# Patient Record
Sex: Male | Born: 1980 | Race: White | Hispanic: No | Marital: Married | Smoking: Former smoker
Health system: Southern US, Community
[De-identification: ages and names within clinical notes are randomized; demographics above are authoritative.]

## PROBLEM LIST (undated history)

## (undated) DIAGNOSIS — N62 Hypertrophy of breast: Secondary | ICD-10-CM

## (undated) DIAGNOSIS — I491 Atrial premature depolarization: Secondary | ICD-10-CM

## (undated) DIAGNOSIS — L409 Psoriasis, unspecified: Secondary | ICD-10-CM

## (undated) DIAGNOSIS — I10 Essential (primary) hypertension: Secondary | ICD-10-CM

## (undated) DIAGNOSIS — E785 Hyperlipidemia, unspecified: Secondary | ICD-10-CM

## (undated) DIAGNOSIS — G4733 Obstructive sleep apnea (adult) (pediatric): Secondary | ICD-10-CM

## (undated) HISTORY — DX: Psoriasis, unspecified: L40.9

## (undated) HISTORY — DX: Atrial premature depolarization: I49.1

## (undated) HISTORY — DX: Essential (primary) hypertension: I10

## (undated) HISTORY — DX: Obstructive sleep apnea (adult) (pediatric): G47.33

## (undated) HISTORY — DX: Hypertrophy of breast: N62

## (undated) HISTORY — DX: Hyperlipidemia, unspecified: E78.5

## (undated) HISTORY — PX: NO PAST SURGERIES: SHX2092

---

## 1999-01-07 ENCOUNTER — Emergency Department (HOSPITAL_COMMUNITY): Admission: EM | Admit: 1999-01-07 | Discharge: 1999-01-07 | Payer: Self-pay | Admitting: Emergency Medicine

## 2002-05-31 ENCOUNTER — Encounter: Payer: Self-pay | Admitting: Emergency Medicine

## 2002-05-31 ENCOUNTER — Emergency Department (HOSPITAL_COMMUNITY): Admission: EM | Admit: 2002-05-31 | Discharge: 2002-05-31 | Payer: Self-pay | Admitting: Emergency Medicine

## 2002-08-18 ENCOUNTER — Emergency Department (HOSPITAL_COMMUNITY): Admission: EM | Admit: 2002-08-18 | Discharge: 2002-08-18 | Payer: Self-pay | Admitting: Emergency Medicine

## 2003-03-17 ENCOUNTER — Encounter: Admission: RE | Admit: 2003-03-17 | Discharge: 2003-03-17 | Payer: Self-pay | Admitting: Internal Medicine

## 2003-03-17 ENCOUNTER — Encounter: Payer: Self-pay | Admitting: Internal Medicine

## 2008-07-14 ENCOUNTER — Encounter: Admission: RE | Admit: 2008-07-14 | Discharge: 2008-07-14 | Payer: Self-pay | Admitting: Orthopedic Surgery

## 2008-09-03 ENCOUNTER — Encounter: Admission: RE | Admit: 2008-09-03 | Discharge: 2008-09-03 | Payer: Self-pay | Admitting: Family

## 2008-09-12 ENCOUNTER — Encounter: Admission: RE | Admit: 2008-09-12 | Discharge: 2008-09-12 | Payer: Self-pay | Admitting: Internal Medicine

## 2010-08-02 IMAGING — CR DG CHEST 2V
3 series · 3 of 3 positions shown · non-contrast
Comparison: None available

CLINICAL DATA: Chest pain

CHEST - 2 VIEW

[w chest pa (1 of 2)]
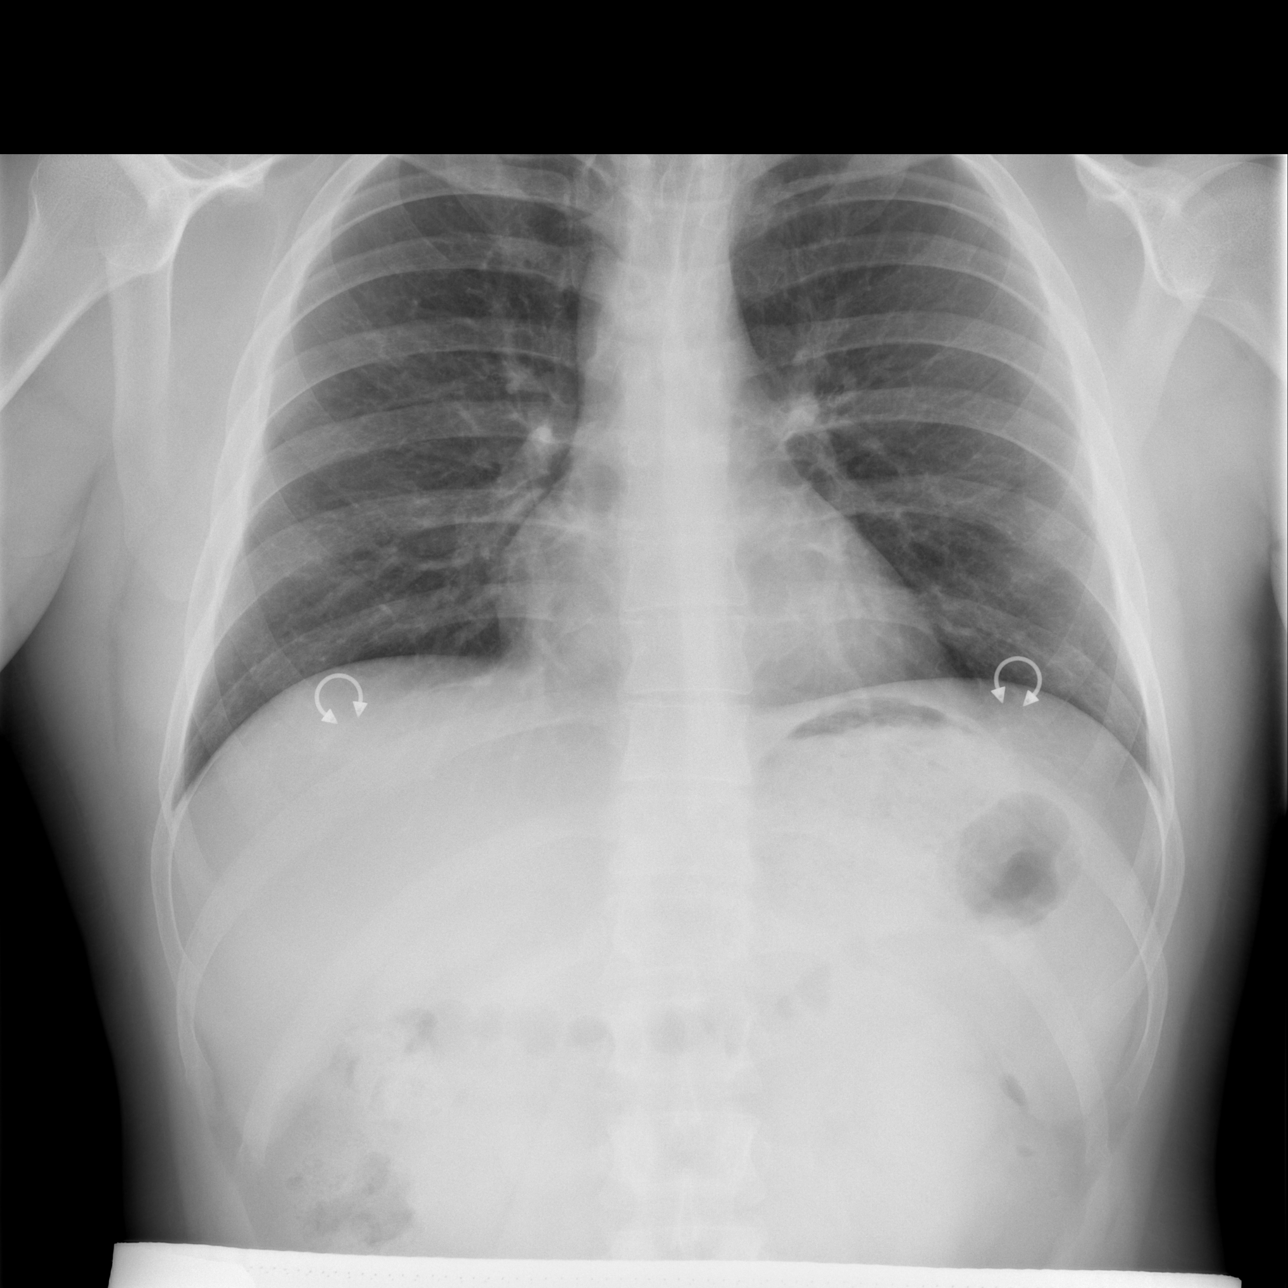

[w chest pa (2 of 2)]
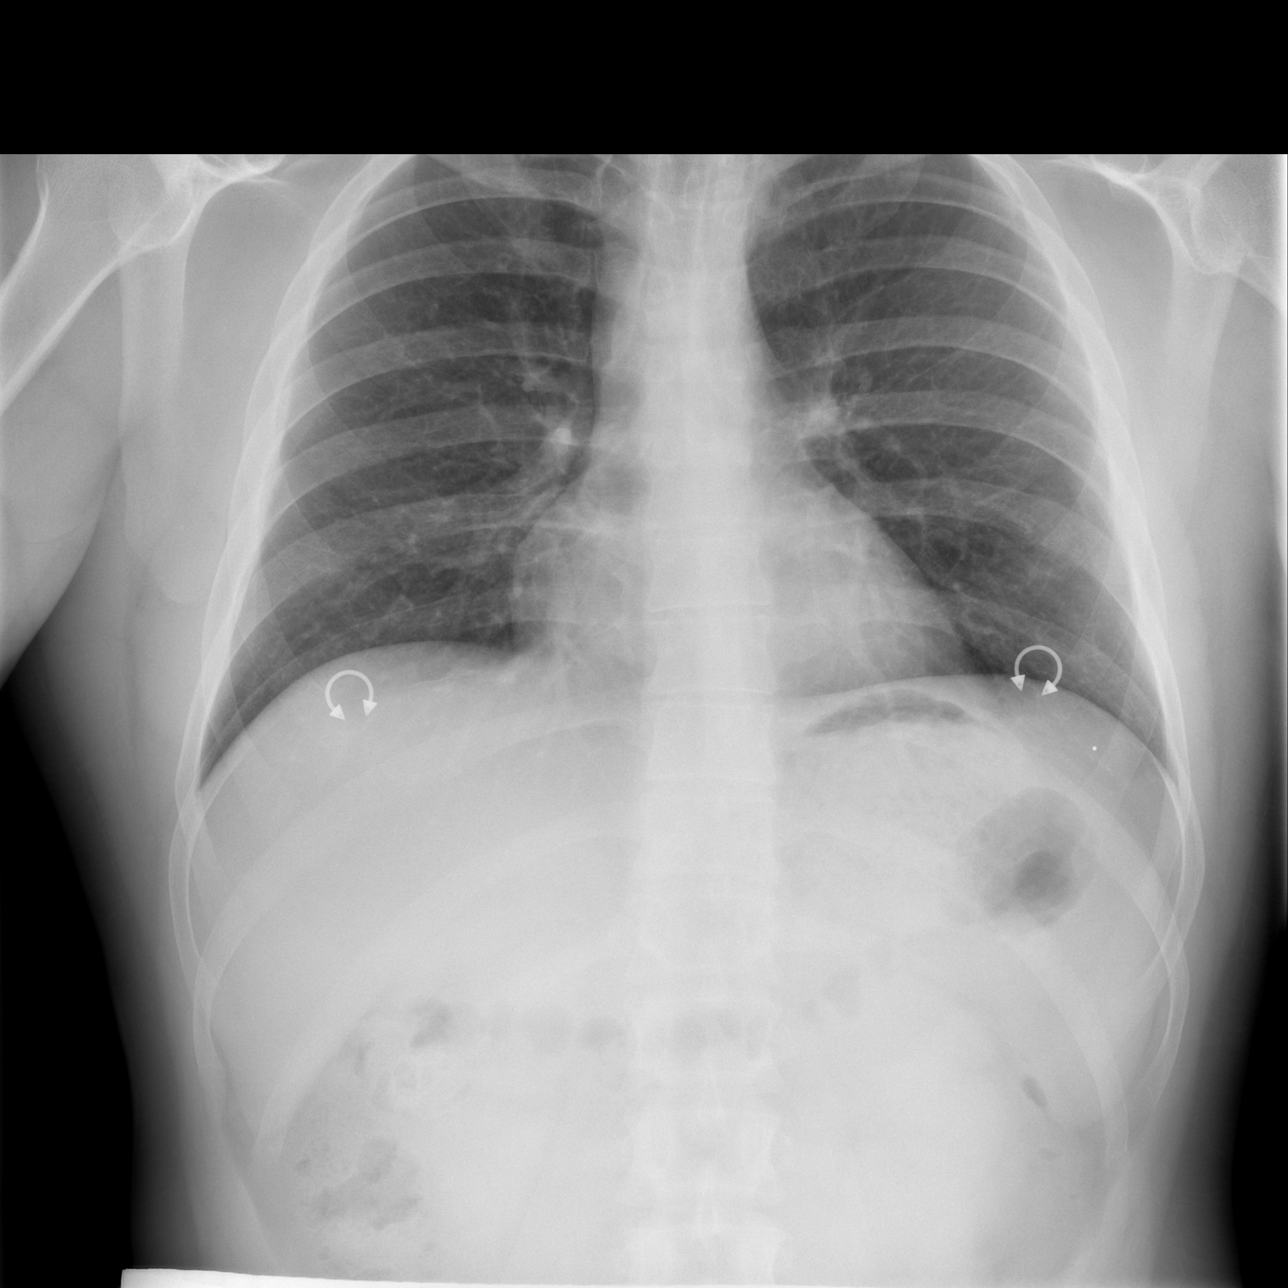

[w chest lat]
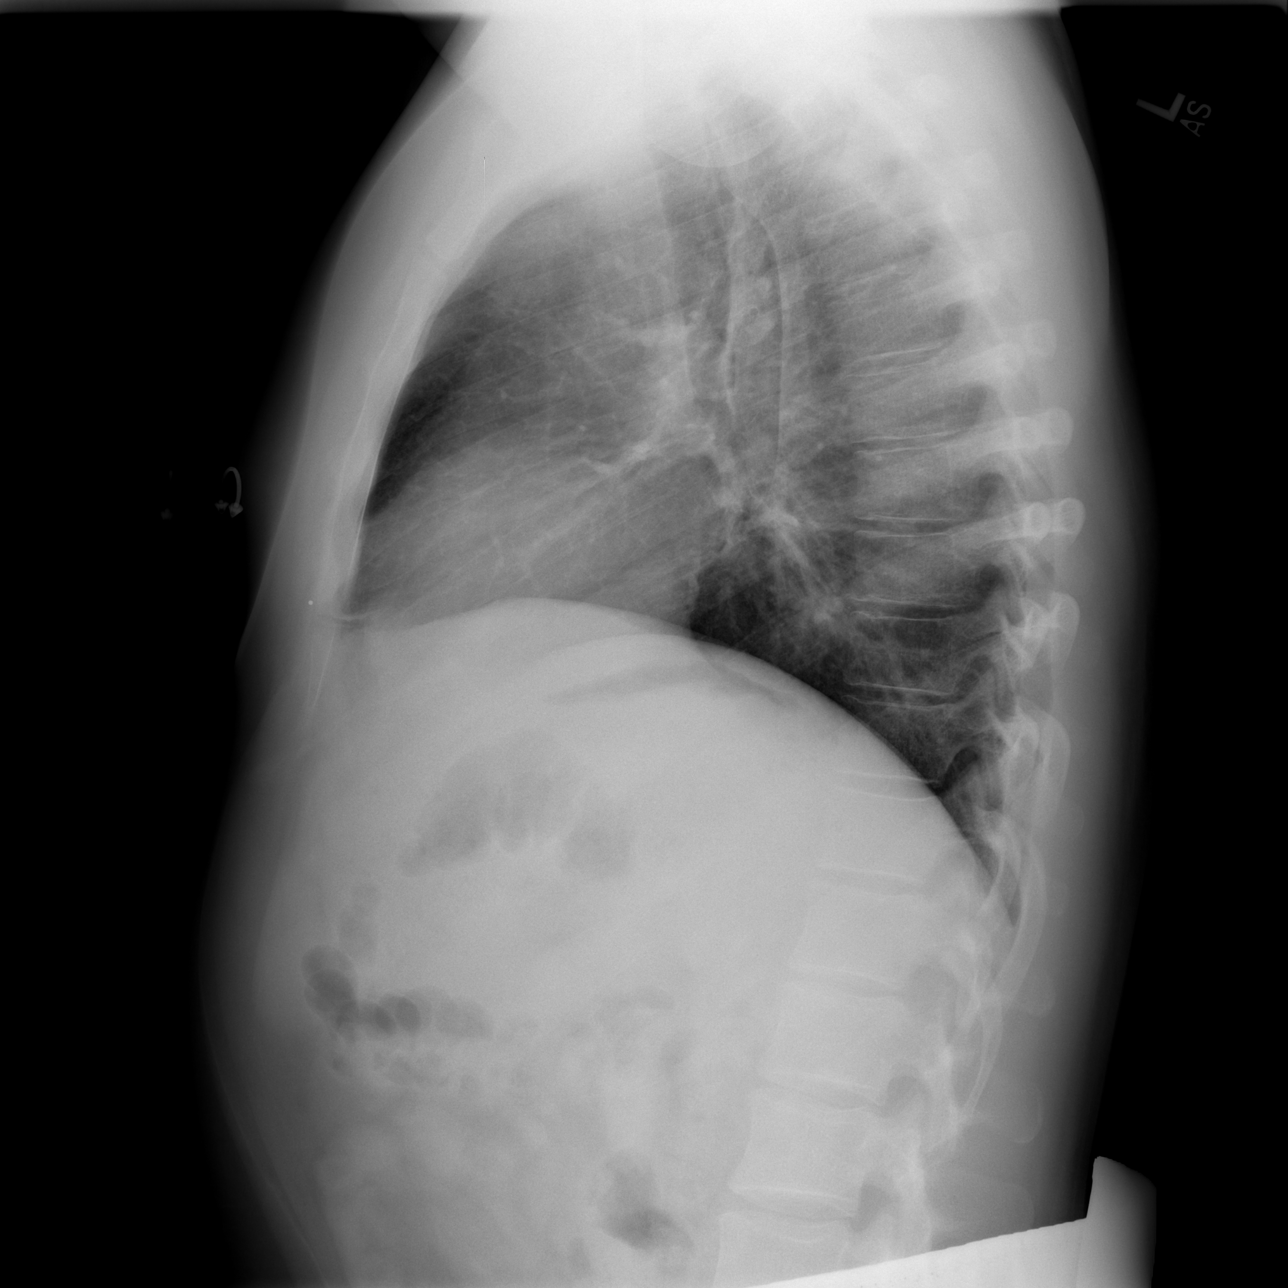

[3 of 3 positions shown; findings below may reference images not displayed]

FINDINGS: Bilateral nipple hardware. Lungs clear.  Heart size and
pulmonary vascularity normal.  No effusion.  Visualized bones
unremarkable.
IMPRESSION: No acute disease

## 2014-05-10 ENCOUNTER — Encounter: Payer: Self-pay | Admitting: *Deleted

## 2016-08-04 ENCOUNTER — Encounter: Payer: Self-pay | Admitting: Family

## 2016-08-04 ENCOUNTER — Ambulatory Visit (INDEPENDENT_AMBULATORY_CARE_PROVIDER_SITE_OTHER): Payer: BLUE CROSS/BLUE SHIELD | Admitting: Family

## 2016-08-04 ENCOUNTER — Other Ambulatory Visit (INDEPENDENT_AMBULATORY_CARE_PROVIDER_SITE_OTHER): Payer: BLUE CROSS/BLUE SHIELD

## 2016-08-04 VITALS — BP 114/78 | HR 71 | Temp 98.5°F | Resp 16 | Ht 69.0 in | Wt 209.8 lb

## 2016-08-04 DIAGNOSIS — R5383 Other fatigue: Secondary | ICD-10-CM | POA: Insufficient documentation

## 2016-08-04 DIAGNOSIS — R413 Other amnesia: Secondary | ICD-10-CM | POA: Insufficient documentation

## 2016-08-04 DIAGNOSIS — R790 Abnormal level of blood mineral: Secondary | ICD-10-CM

## 2016-08-04 LAB — B12 AND FOLATE PANEL
Folate: 17.6 ng/mL (ref >5.9)
VITAMIN B 12: 592 pg/mL (ref 211–911)

## 2016-08-04 LAB — CBC
HEMATOCRIT: 42.7 % (ref 39.0–52.0)
Hemoglobin: 14.9 g/dL (ref 13.0–17.0)
MCHC: 34.8 g/dL (ref 30.0–36.0)
MCV: 90.9 fl (ref 78.0–100.0)
PLATELETS: 203 10*3/uL (ref 150.0–400.0)
RBC: 4.7 Mil/uL (ref 4.22–5.81)
RDW: 12.5 % (ref 11.5–15.5)
WBC: 5.6 10*3/uL (ref 4.0–10.5)

## 2016-08-04 LAB — IBC PANEL
Iron: 229 ug/dL — ABNORMAL HIGH (ref 42–165)
Saturation Ratios: 64.7 % — ABNORMAL HIGH (ref 20.0–50.0)
Transferrin: 253 mg/dL (ref 212.0–360.0)

## 2016-08-04 LAB — TSH: TSH: 2.31 u[IU]/mL (ref 0.35–4.50)

## 2016-08-04 LAB — HEMOGLOBIN A1C: Hgb A1c MFr Bld: 5.6 % (ref 4.6–6.5)

## 2016-08-04 NOTE — Assessment & Plan Note (Signed)
Symptoms of fatigue and daytime sleepiness concerning for obstructive sleep apnea. Obtain B12/Foley, CBC, A1c, IBC panel, and TSH to rule out metabolic causes. Unlikely depression or cardiovascular disease. Referral to pulmonology placed for sleep testing. Continue to monitor.

## 2016-08-04 NOTE — Assessment & Plan Note (Signed)
Memory difficulty with concern for possible ADD/ADHD given description of difficulty concentrating and completing tasks. Referral to psychology place for formal testing. Follow-up pending testing results.

## 2016-08-04 NOTE — Progress Notes (Signed)
Subjective:    Patient ID: Jeffery Green, male    DOB: Jan 26, 1981, 35 y.o.   MRN: [id number]  Chief Complaint  Patient presents with  . Establish Care    tired all the time and has issues with forgetting things, x1 year    HPI:  Jeffery Green is a 35 y.o. male who  has a past medical history of PAC (premature atrial contraction) and Psoriasis. and presents today for an office visit to establish care.   1.) Fatigue -  This is a new problem. Associated symptom of fatigue and feeling tired/sleepy has been going on for about 1 year. Feels as though he can go to sleep fairly readily. Averaging about 7-8 hours of sleep per night. He does snore at night and has been informed of possible apnea periods. Never tested for sleep apnea. Not well rested after sleep. Denies headaches. Will occasionally have heart palpitations. Modifying factors include breath-rite strips.   2.) Memory issues - This is a new problem. Associated symptoms of forgetfulness has been waxing and waning. Finds that his mind wanders at times and has to but effort into focusing. Does have difficulty completing tasks.    No Known Allergies    Outpatient Medications Prior to Visit  Medication Sig Dispense Refill  . clobetasol cream (TEMOVATE) 0.05 % Apply 1 application topically 2 (two) times daily.    Marland Kitchen. albuterol (PROVENTIL HFA;VENTOLIN HFA) 108 (90 BASE) MCG/ACT inhaler Inhale 2 puffs into the lungs every 4 (four) hours as needed for wheezing or shortness of breath.    . Fluticasone-Salmeterol (ADVAIR) 250-50 MCG/DOSE AEPB Inhale 1 puff into the lungs every 12 (twelve) hours.    . Multiple Vitamin (MULTIVITAMIN) tablet Take 1 tablet by mouth daily.     No facility-administered medications prior to visit.       History reviewed. No pertinent surgical history.    Past Medical History:  Diagnosis Date  . PAC (premature atrial contraction)   . Psoriasis     Review of Systems  Constitutional: Negative  for chills and fever.  Respiratory: Negative for chest tightness and shortness of breath.   Cardiovascular: Positive for palpitations. Negative for chest pain and leg swelling.  Psychiatric/Behavioral: Positive for decreased concentration and sleep disturbance.      Objective:    BP 114/78 (BP Location: Left Arm, Patient Position: Sitting, Cuff Size: Large)   Pulse 71   Temp 98.5 F (36.9 C) (Oral)   Resp 16   Ht 5\' 9"  (1.753 m)   Wt 209 lb 12.8 oz (95.2 kg)   SpO2 98%   BMI 30.98 kg/m  Nursing note and vital signs reviewed.  Physical Exam  Constitutional: He is oriented to person, place, and time. He appears well-developed and well-nourished. No distress.  Cardiovascular: Normal rate, regular rhythm, normal heart sounds and intact distal pulses.   Pulmonary/Chest: Effort normal and breath sounds normal.  Neurological: He is alert and oriented to person, place, and time.  Skin: Skin is warm and dry.  Psychiatric: He has a normal mood and affect. His behavior is normal. Judgment and thought content normal.       Assessment & Plan:   Problem List Items Addressed This Visit      Other   Fatigue - Primary    Symptoms of fatigue and daytime sleepiness concerning for obstructive sleep apnea. Obtain B12/Foley, CBC, A1c, IBC panel, and TSH to rule out metabolic causes. Unlikely depression or cardiovascular disease. Referral to pulmonology  placed for sleep testing. Continue to monitor.      Relevant Orders   B12 and Folate Panel (Completed)   CBC (Completed)   Hemoglobin A1c (Completed)   IBC panel (Completed)   TSH (Completed)   Ambulatory referral to Pulmonology   Memory difficulty    Memory difficulty with concern for possible ADD/ADHD given description of difficulty concentrating and completing tasks. Referral to psychology place for formal testing. Follow-up pending testing results.      Relevant Orders   Ambulatory referral to Psychology       I have discontinued  Mr. Yannuzzi's multivitamin, albuterol, and Fluticasone-Salmeterol. I am also having him maintain his clobetasol cream.   Follow-up: Return if symptoms worsen or fail to improve.  Jeanine Luzalone, Gregory, FNP

## 2016-08-04 NOTE — Patient Instructions (Addendum)
Thank you for choosing [facility].  SUMMARY AND INSTRUCTIONS:  They will call to schedule your appointments with psychology and pulmonology.   Labs:  Please stop by the lab on the lower level of the building for your blood work. Your results will be released to MyChart (or called to you) after review, usually within 72 hours after test completion. If any changes need to be made, you will be notified at that same time.  1.) The lab is open from 7:30am to 5:30 pm Monday-Friday 2.) No appointment is necessary 3.) Fasting (if needed) is 6-8 hours after food and drink; black coffee and water are okay   Follow up:  If your symptoms worsen or fail to improve, please contact our office for further instruction, or in case of emergency go directly to the emergency room at the closest medical facility.    Sleep Apnea  Sleep apnea is a sleep disorder characterized by abnormal pauses in breathing while you sleep. When your breathing pauses, the level of oxygen in your blood decreases. This causes you to move out of deep sleep and into light sleep. As a result, your quality of sleep is poor, and the system that carries your blood throughout your body (cardiovascular system) experiences stress. If sleep apnea remains untreated, the following conditions can develop:  High blood pressure (hypertension).  Coronary artery disease.  Inability to achieve or maintain an erection (impotence).  Impairment of your thought process (cognitive dysfunction). There are three types of sleep apnea: 1. Obstructive sleep apnea--Pauses in breathing during sleep because of a blocked airway. 2. Central sleep apnea--Pauses in breathing during sleep because the area of the brain that controls your breathing does not send the correct signals to the muscles that control breathing. 3. Mixed sleep apnea--A combination of both obstructive and central sleep apnea. RISK FACTORS The following risk factors can increase  your risk of developing sleep apnea:  Being overweight.  Smoking.  Having narrow passages in your nose and throat.  Being of older age.  Being male.  Alcohol use.  Sedative and tranquilizer use.  Ethnicity. Among individuals younger than 35 years, African Americans are at increased risk of sleep apnea. SYMPTOMS   Difficulty staying asleep.  Daytime sleepiness and fatigue.  Loss of energy.  Irritability.  Loud, heavy snoring.  Morning headaches.  Trouble concentrating.  Forgetfulness.  Decreased interest in sex.  Unexplained sleepiness. DIAGNOSIS  In order to diagnose sleep apnea, your caregiver will perform a physical examination. A sleep study done in the comfort of your own home may be appropriate if you are otherwise healthy. Your caregiver may also recommend that you spend the night in a sleep lab. In the sleep lab, several monitors record information about your heart, lungs, and brain while you sleep. Your leg and arm movements and blood oxygen level are also recorded. TREATMENT The following actions may help to resolve mild sleep apnea:  Sleeping on your side.   Using a decongestant if you have nasal congestion.   Avoiding the use of depressants, including alcohol, sedatives, and narcotics.   Losing weight and modifying your diet if you are overweight. There also are devices and treatments to help open your airway:  Oral appliances. These are custom-made mouthpieces that shift your lower jaw forward and slightly open your bite. This opens your airway.  Devices that create positive airway pressure. This positive pressure "splints" your airway open to help you breathe better during sleep. The following devices create positive airway  pressure:  Continuous positive airway pressure (CPAP) device. The CPAP device creates a continuous level of air pressure with an air pump. The air is delivered to your airway through a mask while you sleep. This continuous  pressure keeps your airway open.  Nasal expiratory positive airway pressure (EPAP) device. The EPAP device creates positive air pressure as you exhale. The device consists of single-use valves, which are inserted into each nostril and held in place by adhesive. The valves create very little resistance when you inhale but create much more resistance when you exhale. That increased resistance creates the positive airway pressure. This positive pressure while you exhale keeps your airway open, making it easier to breath when you inhale again.  Bilevel positive airway pressure (BPAP) device. The BPAP device is used mainly in patients with central sleep apnea. This device is similar to the CPAP device because it also uses an air pump to deliver continuous air pressure through a mask. However, with the BPAP machine, the pressure is set at two different levels. The pressure when you exhale is lower than the pressure when you inhale.  Surgery. Typically, surgery is only done if you cannot comply with less invasive treatments or if the less invasive treatments do not improve your condition. Surgery involves removing excess tissue in your airway to create a wider passage way.   This information is not intended to replace advice given to you by your health care provider. Make sure you discuss any questions you have with your health care provider.   Document Released: 09/02/2002 Document Revised: 10/03/2014 Document Reviewed: 01/19/2012 Elsevier Interactive Patient Education 2016 [employer].   Attention Deficit Hyperactivity Disorder Attention deficit hyperactivity disorder (ADHD) is a problem with behavior issues based on the way the brain functions (neurobehavioral disorder). It is a common reason for behavior and academic problems in school. SYMPTOMS  There are 3 types of ADHD. The 3 types and some of the symptoms include:  Inattentive.  Gets bored or distracted easily.  Loses or forgets things.  Forgets to hand in homework.  Has trouble organizing or completing tasks.  Difficulty staying on task.  An inability to organize daily tasks and school work.  Leaving projects, chores, or homework unfinished.  Trouble paying attention or responding to details. Careless mistakes.  Difficulty following directions. Often seems like is not listening.  Dislikes activities that require sustained attention (like chores or homework).  Hyperactive-impulsive.  Feels like it is impossible to sit still or stay in a seat. Fidgeting with hands and feet.  Trouble waiting turn.  Talking too much or out of turn. Interruptive.  Speaks or acts impulsively.  Aggressive, disruptive behavior.  Constantly busy or on the go; noisy.  Often leaves seat when they are expected to remain seated.  Often runs or climbs where it is not appropriate, or feels very restless.  Combined.  Has symptoms of both of the above. Often children with ADHD feel discouraged about themselves and with school. They often perform well below their abilities in school. As children get older, the excess motor activities can calm down, but the problems with paying attention and staying organized persist. Most children do not outgrow ADHD but with good treatment can learn to cope with the symptoms. DIAGNOSIS  When ADHD is suspected, the diagnosis should be made by professionals trained in ADHD. This professional will collect information about the individual suspected of having ADHD. Information must be collected from various settings where the person lives, works, or attends  school.  Diagnosis will include:  Confirming symptoms began in childhood.  Ruling out other reasons for the child's behavior.  The health care providers will check with the child's school and check their medical records.  They will talk to teachers and parents.  Behavior rating scales for the child will be filled out by those dealing with the child  on a daily basis. A diagnosis is made only after all information has been considered. TREATMENT  Treatment usually includes behavioral treatment, tutoring or extra support in school, and stimulant medicines. Because of the way a person's brain works with ADHD, these medicines decrease impulsivity and hyperactivity and increase attention. This is different than how they would work in a person who does not have ADHD. Other medicines used include antidepressants and certain blood pressure medicines. Most experts agree that treatment for ADHD should address all aspects of the person's functioning. Along with medicines, treatment should include structured classroom management at school. Parents should reward good behavior, provide constant discipline, and set limits. Tutoring should be available for the child as needed. ADHD is a lifelong condition. If untreated, the disorder can have long-term serious effects into adolescence and adulthood. HOME CARE INSTRUCTIONS   Often with ADHD there is a lot of frustration among family members dealing with the condition. Blame and anger are also feelings that are common. In many cases, because the problem affects the family as a whole, the entire family may need help. A therapist can help the family find better ways to handle the disruptive behaviors of the person with ADHD and promote change. If the person with ADHD is young, most of the therapist's work is with the parents. Parents will learn techniques for coping with and improving their child's behavior. Sometimes only the child with the ADHD needs counseling. Your health care providers can help you make these decisions.  Children with ADHD may need help learning how to organize. Some helpful tips include:  Keep routines the same every day from wake-up time to bedtime. Schedule all activities, including homework and playtime. Keep the schedule in a place where the person with ADHD will often see it. Mark schedule  changes as far in advance as possible.  Schedule outdoor and indoor recreation.  Have a place for everything and keep everything in its place. This includes clothing, backpacks, and school supplies.  Encourage writing down assignments and bringing home needed books. Work with your child's teachers for assistance in organizing school work.  Offer your child a well-balanced diet. Breakfast that includes a balance of whole grains, protein, and fruits or vegetables is especially important for school performance. Children should avoid drinks with caffeine including:  Soft drinks.  Coffee.  Tea.  However, some older children (adolescents) may find these drinks helpful in improving their attention. Because it can also be common for adolescents with ADHD to become addicted to caffeine, talk with your health care provider about what is a safe amount of caffeine intake for your child.  Children with ADHD need consistent rules that they can understand and follow. If rules are followed, give small rewards. Children with ADHD often receive, and expect, criticism. Look for good behavior and praise it. Set realistic goals. Give clear instructions. Look for activities that can foster success and self-esteem. Make time for pleasant activities with your child. Give lots of affection.  Parents are their children's greatest advocates. Learn as much as possible about ADHD. This helps you become a stronger and better advocate for your child.  It also helps you educate your child's teachers and instructors if they feel inadequate in these areas. Parent support groups are often helpful. A national group with local chapters is called Children and Adults with Attention Deficit Hyperactivity Disorder (CHADD). SEEK MEDICAL CARE IF:  Your child has repeated muscle twitches, cough, or speech outbursts.  Your child has sleep problems.  Your child has a marked loss of appetite.  Your child develops depression.  Your  child has new or worsening behavioral problems.  Your child develops dizziness.  Your child has a racing heart.  Your child has stomach pains.  Your child develops headaches. SEEK IMMEDIATE MEDICAL CARE IF:  Your child has been diagnosed with depression or anxiety and the symptoms seem to be getting worse.  Your child has been depressed and suddenly appears to have increased energy or motivation.  You are worried that your child is having a bad reaction to a medication he or she is taking for ADHD.   This information is not intended to replace advice given to you by your health care provider. Make sure you discuss any questions you have with your health care provider.   Document Released: 09/02/2002 Document Revised: 09/17/2013 Document Reviewed: 05/20/2013 Elsevier Interactive Patient Education [employer]

## 2016-08-05 ENCOUNTER — Encounter: Payer: Self-pay | Admitting: Family

## 2016-08-05 ENCOUNTER — Other Ambulatory Visit (INDEPENDENT_AMBULATORY_CARE_PROVIDER_SITE_OTHER): Payer: BLUE CROSS/BLUE SHIELD

## 2016-08-05 DIAGNOSIS — R790 Abnormal level of blood mineral: Secondary | ICD-10-CM | POA: Diagnosis not present

## 2016-08-05 LAB — IBC PANEL
IRON: 159 ug/dL (ref 42–165)
SATURATION RATIOS: 46.9 % (ref 20.0–50.0)
TRANSFERRIN: 242 mg/dL (ref 212.0–360.0)

## 2016-08-11 ENCOUNTER — Encounter: Payer: Self-pay | Admitting: Pulmonary Disease

## 2016-08-11 ENCOUNTER — Ambulatory Visit (INDEPENDENT_AMBULATORY_CARE_PROVIDER_SITE_OTHER): Payer: BLUE CROSS/BLUE SHIELD | Admitting: Pulmonary Disease

## 2016-08-11 VITALS — BP 138/88 | HR 92 | Ht 69.0 in | Wt 208.6 lb

## 2016-08-11 DIAGNOSIS — G4710 Hypersomnia, unspecified: Secondary | ICD-10-CM | POA: Diagnosis not present

## 2016-08-11 NOTE — Patient Instructions (Signed)

## 2016-08-11 NOTE — Progress Notes (Signed)
Subjective:    Patient ID: Jeffery KrebsMichael T Wojahn, male    DOB: 06/28/1981, 35 y.o.   MRN: [id number]  HPI   This is the case of Jeffery Green, 35 y.o. Male, who was referred by Dr. Jeanine LuzGregory Calone  in consultation regarding possible OSA.   As you very well know, patient has a 15 PY smoking history, quit 2012. Has not been diagnosed with asthma or copd. Has sinus issues, flaring in spring time.   Patient has snoring, gasping, choking, witnessed apneas. Sleeps 7-8 hrs per night. Wakes up unrefreshed in am. 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 Hypersomnia affects his fxnality. He has 2 kids -- 2yo and 697 yo -- and they make hypersomnia worse. Occasional sleep talking.    ESS 17. Very symptomatic.       Review of Systems  Constitutional: Positive for unexpected weight change. Negative for fever.  HENT: Positive for congestion and sinus pressure. Negative for dental problem, ear pain, nosebleeds, postnasal drip, rhinorrhea, sneezing, sore throat and trouble swallowing.   Eyes: Negative.  Negative for redness and itching.  Respiratory: Positive for shortness of breath. Negative for cough, chest tightness and wheezing.   Cardiovascular: Negative.  Negative for palpitations and leg swelling.  Gastrointestinal: Negative.  Negative for nausea and vomiting.  Endocrine: Negative.   Genitourinary: Negative.  Negative for dysuria.  Musculoskeletal: Negative.  Negative for joint swelling.  Skin: Negative.  Negative for rash.  Allergic/Immunologic: Positive for environmental allergies.  Neurological: Negative.  Negative for headaches.  Hematological: Negative.  Does not bruise/bleed easily.  Psychiatric/Behavioral: Negative.  Negative for dysphoric mood. The patient is not nervous/anxious.    Past Medical History:  Diagnosis Date  . PAC (premature atrial contraction)   . Psoriasis     (-) CA, DVT  Family History  Problem Relation Age of Onset  . Lupus Mother    . Heart attack Father   . Diabetes Maternal Grandfather      No past surgical history on file.(-) surgery.   Social History   Social History  . Marital status: Married    Spouse name: N/A  . Number of children: 2  . Years of education: 12   Occupational History  . Occupational hygienistetail Manager     Social History Main Topics  . Smoking status: Former Smoker    Packs/day: 1.50    Years: 18.00  . Smokeless tobacco: Never Used  . Alcohol use 12.6 oz/week    21 Cans of beer per week  . Drug use: No  . Sexual activity: Not on file   Other Topics Concern  . Not on file   Social History Narrative   Fun: Systems developerBlacksmithing, working on cars, play with son   Does retail work.   No Known Allergies   Outpatient Medications Prior to Visit  Medication Sig Dispense Refill  . clobetasol cream (TEMOVATE) 0.05 % Apply 1 application topically 2 (two) times daily.     No facility-administered medications prior to visit.    No orders of the defined types were placed in this encounter.        Objective:   Physical Exam  Vitals:  Vitals:   08/11/16 1020  BP: 138/88  Pulse: 92  SpO2: 95%  Weight: 208 lb 9.6 oz (94.6 kg)  Height: 5\' 9"  (1.753 m)    Constitutional/General:  Pleasant, well-nourished, well-developed, not in any distress,  Comfortably seating.  Well kempt  Body mass index is  30.8 kg/m. Wt Readings from Last 3 Encounters:  08/11/16 208 lb 9.6 oz (94.6 kg)  08/04/16 209 lb 12.8 oz (95.2 kg)    HEENT: Pupils equal and reactive to light and accommodation. Anicteric sclerae. Normal nasal mucosa.   No oral  lesions,  mouth clear,  oropharynx clear, no postnasal drip. (-) Oral thrush. No dental caries.  Airway - Mallampati class III. (+) retrognathia.   Neck: No masses. Midline trachea. No JVD, (-) LAD. (-) bruits appreciated.  Respiratory/Chest: Grossly normal chest. (-) deformity. (-) Accessory muscle use.  Symmetric expansion. (-) Tenderness on palpation.  Resonant on  percussion.  Diminished BS on both lower lung zones. (-) wheezing, crackles, rhonchi (-) egophony  Cardiovascular: Regular rate and  rhythm, heart sounds normal, no murmur or gallops, no peripheral edema  Gastrointestinal:  Normal bowel sounds. Soft, non-tender. No hepatosplenomegaly.  (-) masses.   Musculoskeletal:  Normal muscle tone. Normal gait.   Extremities: Grossly normal. (-) clubbing, cyanosis.  (-) edema  Skin: (-) rash,lesions seen.   Neurological/Psychiatric : alert, oriented to time, place, person. Normal mood and affect          Assessment & Plan:  Hypersomnia Patient has snoring, gasping, choking, witnessed apneas. Sleeps 7-8 hrs per night. Wakes up unrefreshed in am. 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 Hypersomnia affects his fxnality. He has 2 kids -- 2yo and 127 yo -- and they make hypersomnia worse. Occasional sleep talking.    ESS 17. Very symptomatic.    Plan :  We discussed about the diagnosis of Obstructive Sleep Apnea (OSA) and implications of untreated OSA. We discussed about CPAP and BiPaP as possible treatment options.    We will schedule the patient for a sleep study. Plan for a HST. Very symptomatic. If HST is (-), plan for a lab study. Anticipate no issues with cpap. Likely moderate.    Patient was instructed to call the office if he/she has not heard back from the office 1-2 weeks after the sleep study.   Patient was instructed to call the office if he/she is having issues with the PAP device.   We discussed good sleep hygiene.   Patient was advised not to engage in activities requiring concentration and/or vigilance if he/she is sleepy.  Patient was advised not to drive if he/she is sleepy.       Thank you very much for letting me participate in this patient's care. Please do not hesitate to give me a call if you have any questions or concerns regarding the treatment plan.   Patient will follow up with me  in 6-8 weeks.     Pollie MeyerJ. Angelo A. de Dios, MD 08/11/2016   10:50 AM Pulmonary and Critical Care Medicine [facility] Pager: [phone] Office: [phone], Fax: [phone]

## 2016-08-11 NOTE — Assessment & Plan Note (Signed)
Patient has snoring, gasping, choking, witnessed apneas. Sleeps 7-8 hrs per night. Wakes up unrefreshed in am. 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 Hypersomnia affects his fxnality. He has 2 kids -- 37yo and 627 yo -- and they make hypersomnia worse. Occasional sleep talking.    ESS 17. Very symptomatic.    Plan :  We discussed about the diagnosis of Obstructive Sleep Apnea (OSA) and implications of untreated OSA. We discussed about CPAP and BiPaP as possible treatment options.    We will schedule the patient for a sleep study. Plan for a HST. Very symptomatic. If HST is (-), plan for a lab study. Anticipate no issues with cpap. Likely moderate.    Patient was instructed to call the office if he/she has not heard back from the office 1-2 weeks after the sleep study.   Patient was instructed to call the office if he/she is having issues with the PAP device.   We discussed good sleep hygiene.   Patient was advised not to engage in activities requiring concentration and/or vigilance if he/she is sleepy.  Patient was advised not to drive if he/she is sleepy.

## 2016-08-16 ENCOUNTER — Ambulatory Visit: Payer: Self-pay | Admitting: Family

## 2016-08-17 ENCOUNTER — Other Ambulatory Visit: Payer: Self-pay

## 2016-08-17 DIAGNOSIS — G4710 Hypersomnia, unspecified: Secondary | ICD-10-CM

## 2016-09-24 ENCOUNTER — Emergency Department (HOSPITAL_COMMUNITY)
Admission: EM | Admit: 2016-09-24 | Discharge: 2016-09-24 | Disposition: A | Discharge status: Home / Self Care | Payer: BLUE CROSS/BLUE SHIELD | Attending: Emergency Medicine | Admitting: Emergency Medicine

## 2016-09-24 ENCOUNTER — Encounter (HOSPITAL_COMMUNITY): Payer: Self-pay | Admitting: Emergency Medicine

## 2016-09-24 DIAGNOSIS — Z87891 Personal history of nicotine dependence: Secondary | ICD-10-CM | POA: Diagnosis not present

## 2016-09-24 DIAGNOSIS — S60416A Abrasion of right little finger, initial encounter: Secondary | ICD-10-CM | POA: Diagnosis not present

## 2016-09-24 DIAGNOSIS — Y9289 Other specified places as the place of occurrence of the external cause: Secondary | ICD-10-CM | POA: Insufficient documentation

## 2016-09-24 DIAGNOSIS — S6991XA Unspecified injury of right wrist, hand and finger(s), initial encounter: Secondary | ICD-10-CM | POA: Diagnosis present

## 2016-09-24 DIAGNOSIS — Y999 Unspecified external cause status: Secondary | ICD-10-CM | POA: Diagnosis not present

## 2016-09-24 DIAGNOSIS — Z7721 Contact with and (suspected) exposure to potentially hazardous body fluids: Secondary | ICD-10-CM | POA: Insufficient documentation

## 2016-09-24 DIAGNOSIS — Y9389 Activity, other specified: Secondary | ICD-10-CM | POA: Insufficient documentation

## 2016-09-24 LAB — COMPREHENSIVE METABOLIC PANEL
ALK PHOS: 70 U/L (ref 38–126)
ALT: 49 U/L (ref 17–63)
ANION GAP: 8 (ref 5–15)
AST: 54 U/L — ABNORMAL HIGH (ref 15–41)
Albumin: 4.1 g/dL (ref 3.5–5.0)
BUN: 10 mg/dL (ref 6–20)
CALCIUM: 9 mg/dL (ref 8.9–10.3)
CO2: 26 mmol/L (ref 22–32)
CREATININE: 0.98 mg/dL (ref 0.61–1.24)
Chloride: 107 mmol/L (ref 101–111)
Glucose, Bld: 126 mg/dL — ABNORMAL HIGH (ref 65–99)
Potassium: 3.9 mmol/L (ref 3.5–5.1)
SODIUM: 141 mmol/L (ref 135–145)
TOTAL PROTEIN: 7.3 g/dL (ref 6.5–8.1)
Total Bilirubin: 0.7 mg/dL (ref 0.3–1.2)

## 2016-09-24 LAB — CBC WITH DIFFERENTIAL/PLATELET
Basophils Absolute: 0 10*3/uL (ref 0.0–0.1)
Basophils Relative: 0 %
EOS ABS: 0 10*3/uL (ref 0.0–0.7)
EOS PCT: 1 %
HCT: 42.4 % (ref 39.0–52.0)
HEMOGLOBIN: 14.5 g/dL (ref 13.0–17.0)
LYMPHS ABS: 2.2 10*3/uL (ref 0.7–4.0)
LYMPHS PCT: 44 %
MCH: 31.4 pg (ref 26.0–34.0)
MCHC: 34.2 g/dL (ref 30.0–36.0)
MCV: 91.8 fL (ref 78.0–100.0)
MONOS PCT: 5 %
Monocytes Absolute: 0.3 10*3/uL (ref 0.1–1.0)
Neutro Abs: 2.5 10*3/uL (ref 1.7–7.7)
Neutrophils Relative %: 50 %
Platelets: 189 10*3/uL (ref 150–400)
RBC: 4.62 MIL/uL (ref 4.22–5.81)
RDW: 11.8 % (ref 11.5–15.5)
WBC: 5.1 10*3/uL (ref 4.0–10.5)

## 2016-09-24 LAB — RAPID HIV SCREEN (HIV 1/2 AB+AG)
HIV 1/2 Antibodies: NONREACTIVE
HIV-1 P24 ANTIGEN - HIV24: NONREACTIVE
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
## 2016-09-24 NOTE — Discharge Instructions (Signed)
Your rapid HIV today is non-reactive.  Your hepatitis panels are pending and will be available in the next couple of days.  You will receive a phone call for any abnormal results.  Follow up with your primary care physician.

## 2016-09-25 LAB — HEPATITIS C ANTIBODY

## 2016-09-25 LAB — HEPATITIS PANEL, ACUTE
HEP A IGM: NEGATIVE
HEP B C IGM: NEGATIVE
HEP B S AG: NEGATIVE

## 2016-09-28 ENCOUNTER — Ambulatory Visit (INDEPENDENT_AMBULATORY_CARE_PROVIDER_SITE_OTHER): Payer: BLUE CROSS/BLUE SHIELD | Admitting: Licensed Clinical Social Worker

## 2016-09-28 DIAGNOSIS — F321 Major depressive disorder, single episode, moderate: Secondary | ICD-10-CM

## 2016-10-04 ENCOUNTER — Telehealth: Payer: Self-pay | Admitting: Pulmonary Disease

## 2016-10-04 NOTE — Telephone Encounter (Signed)
Patient called regarding canceled sleep study - advised that insurance will approve HST and [facility]

## 2016-10-04 NOTE — Telephone Encounter (Signed)
[employer] denied in lab study which is scheduled for 10/10/16. I LMOAM for Terri at the sleep lab to cancel in lab study. [employer] approved HST - Authorization # [id number].    Please contact patient and advise that in lab study has been cancelled and arrange HST set up.  Also please cancel in lab study order.  Thanks [employer]

## 2016-10-10 ENCOUNTER — Encounter (HOSPITAL_BASED_OUTPATIENT_CLINIC_OR_DEPARTMENT_OTHER): Payer: BLUE CROSS/BLUE SHIELD

## 2016-10-10 DIAGNOSIS — G4733 Obstructive sleep apnea (adult) (pediatric): Secondary | ICD-10-CM | POA: Diagnosis not present

## 2016-10-17 ENCOUNTER — Ambulatory Visit: Payer: BLUE CROSS/BLUE SHIELD | Admitting: Pulmonary Disease

## 2016-10-18 ENCOUNTER — Telehealth: Payer: Self-pay | Admitting: Pulmonary Disease

## 2016-10-18 DIAGNOSIS — G4733 Obstructive sleep apnea (adult) (pediatric): Secondary | ICD-10-CM

## 2016-10-18 NOTE — Telephone Encounter (Signed)
  Please call the pt and tell the pt the HOME SLEEP STUDY  showed OSA  Pt stops breathing  5  times an hour.   Home sleep study was done on : 10/10/16  As he is very symptomatic, we will try cpap.   Please order autoCPAP 5-15 cm H2O. Patient will need a mask fitting session. Patient will need a 1 month download.   Patient needs to be seen by me or any of the NPs/APPs  4-6 weeks after obtaining the cpap machine. Let me know if you receive this.   Thanks!   J. Alexis FrockAngelo A de Dios, MD 10/18/2016, 11:42 PM

## 2016-10-21 ENCOUNTER — Other Ambulatory Visit: Payer: Self-pay | Admitting: *Deleted

## 2016-10-21 DIAGNOSIS — G4710 Hypersomnia, unspecified: Secondary | ICD-10-CM

## 2016-10-21 DIAGNOSIS — G4733 Obstructive sleep apnea (adult) (pediatric): Secondary | ICD-10-CM | POA: Diagnosis not present

## 2016-10-21 NOTE — Telephone Encounter (Signed)
Spoke with pt and notified of results per AD. Pt verbalized understanding and denied any questions. [facility]

## 2016-11-10 ENCOUNTER — Ambulatory Visit: Payer: BLUE CROSS/BLUE SHIELD | Admitting: Psychology

## 2016-12-06 ENCOUNTER — Ambulatory Visit: Payer: BLUE CROSS/BLUE SHIELD | Admitting: Pulmonary Disease

## 2016-12-06 ENCOUNTER — Encounter: Payer: Self-pay | Admitting: Pulmonary Disease

## 2016-12-07 ENCOUNTER — Encounter: Payer: Self-pay | Admitting: Pulmonary Disease

## 2016-12-07 ENCOUNTER — Ambulatory Visit (INDEPENDENT_AMBULATORY_CARE_PROVIDER_SITE_OTHER): Payer: BLUE CROSS/BLUE SHIELD | Admitting: Pulmonary Disease

## 2016-12-07 DIAGNOSIS — G4733 Obstructive sleep apnea (adult) (pediatric): Secondary | ICD-10-CM

## 2016-12-07 NOTE — Assessment & Plan Note (Addendum)
Patient has snoring, gasping, choking, witnessed apneas. Sleeps 7-8 hrs per night. Wakes up unrefreshed in am. 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 Hypersomnia affects his fxnality. He has 2 kids -- 38yo and 327 yo -- and they make hypersomnia worse. Occasional sleep talking.    ESS 17. Very symptomatic.    HST with mild AHI at 5 but he significantly improved on auto CPAP 5-15 centimeters water. Feels better using it. More energy. Feels benefit of CPAP. Has nasal pillows. No issues.   Plan :  We extensively discussed the importance of treating OSA and the need to use PAP therapy.   Continue with autocpap 5-15 cm water. DL the last month : 29%83%, AHI 1.3    Patient was instructed to have mask, tubings, filter, reservoir cleaned at least once a week with soapy water.  Patient was instructed to call the office if he/she is having issues with the PAP device.    I advised patient to obtain sufficient amount of sleep --  7 to 8 hours at least in a 24 hr period.  Patient was advised to follow good sleep hygiene.  Patient was advised NOT to engage in activities requiring concentration and/or vigilance if he/she is and  sleepy.  Patient is NOT to drive if he/she is sleepy.

## 2016-12-07 NOTE — Patient Instructions (Signed)
  It was a pleasure taking care of you today!  Continue using your CPAP machine.   Please make sure you use your CPAP device everytime you sleep.  We will monitor the usage of your machine per your insurance requirement.  Your insurance company may take the machine from you if you are not using it regularly.   Please clean the mask, tubings, filter, water reservoir with soapy water every week.  Please use distilled water for the water reservoir.   Please call the office or your machine provider (DME company) if you are having issues with the device.   Return to clinic in 1 year  with NP.    

## 2016-12-07 NOTE — Progress Notes (Signed)
Subjective:    Patient ID: Jeffery Green, male    DOB: [birth year], [age] y.o.   MRN: [id number]  HPI   This is the case of Jeffery Green, 36 y.o. Male, who was referred by Dr. Jeanine LuzGregory Calone  in consultation regarding possible OSA.   As you very well know, patient has a 15 PY smoking history, quit 2012. Has not been diagnosed with asthma or copd. Has sinus issues, flaring in spring time.   Patient has snoring, gasping, choking, witnessed apneas. Sleeps 7-8 hrs per night. Wakes up unrefreshed in am. 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 Hypersomnia affects his fxnality. He has 2 kids -- 2yo and 647 yo -- and they make hypersomnia worse. Occasional sleep talking.    ESS 17. Very symptomatic.    ROV 12/07/16 Patient returns to the office as follow-up on his sleep apnea. His last seen, he had a home sleep study which showed an AHI of 5. He was started on auto CPAP. Feels better using it. More energy. Less sleepiness. Significantly improved. No issues. Uses nasal pillows.   Review of Systems  Constitutional: Positive for unexpected weight change. Negative for fever.  HENT: Positive for congestion and sinus pressure. Negative for dental problem, ear pain, nosebleeds, postnasal drip, rhinorrhea, sneezing, sore throat and trouble swallowing.   Eyes: Negative.  Negative for redness and itching.  Respiratory: Positive for shortness of breath. Negative for cough, chest tightness and wheezing.   Cardiovascular: Negative.  Negative for palpitations and leg swelling.  Gastrointestinal: Negative.  Negative for nausea and vomiting.  Endocrine: Negative.   Genitourinary: Negative.  Negative for dysuria.  Musculoskeletal: Negative.  Negative for joint swelling.  Skin: Negative.  Negative for rash.  Allergic/Immunologic: Positive for environmental allergies.  Neurological: Negative.  Negative for headaches.  Hematological: Negative.  Does not bruise/bleed easily.    Psychiatric/Behavioral: Negative.  Negative for dysphoric mood. The patient is not nervous/anxious.        Objective:   Physical Exam  Vitals:  Vitals:   12/07/16 1526  BP: (!) 172/100  Pulse: 88  SpO2: 97%  Weight: 209 lb 3.2 oz (94.9 kg)  Height: 5\' 9"  (1.753 m)    Constitutional/General:  Pleasant, well-nourished, well-developed, not in any distress,  Comfortably seating.  Well kempt  Body mass index is 30.89 kg/m. Wt Readings from Last 3 Encounters:  12/07/16 209 lb 3.2 oz (94.9 kg)  09/24/16 210 lb (95.3 kg)  08/11/16 208 lb 9.6 oz (94.6 kg)    HEENT: Pupils equal and reactive to light and accommodation. Anicteric sclerae. Normal nasal mucosa.   No oral  lesions,  mouth clear,  oropharynx clear, no postnasal drip. (-) Oral thrush. No dental caries.  Airway - Mallampati class III. (+) retrognathia.   Neck: No masses. Midline trachea. No JVD, (-) LAD. (-) bruits appreciated.  Respiratory/Chest: Grossly normal chest. (-) deformity. (-) Accessory muscle use.  Symmetric expansion. (-) Tenderness on palpation.  Resonant on percussion.  Diminished BS on both lower lung zones. (-) wheezing, crackles, rhonchi (-) egophony  Cardiovascular: Regular rate and  rhythm, heart sounds normal, no murmur or gallops, no peripheral edema  Gastrointestinal:  Normal bowel sounds. Soft, non-tender. No hepatosplenomegaly.  (-) masses.   Musculoskeletal:  Normal muscle tone. Normal gait.   Extremities: Grossly normal. (-) clubbing, cyanosis.  (-) edema  Skin: (-) rash,lesions seen.   Neurological/Psychiatric : alert, oriented to time, place, person. Normal mood and affect  Assessment & Plan:  OSA (obstructive sleep apnea) Patient has snoring, gasping, choking, witnessed apneas. Sleeps 7-8 hrs per night. Wakes up unrefreshed in am. 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 Hypersomnia affects his fxnality. He has 2 kids -- 2yo and 79 yo --  and they make hypersomnia worse. Occasional sleep talking.    ESS 17. Very symptomatic.    HST with mild AHI at 5 but he significantly improved on auto CPAP 5-15 centimeters water. Feels better using it. More energy. Feels benefit of CPAP. Has nasal pillows. No issues.   Plan :  We extensively discussed the importance of treating OSA and the need to use PAP therapy.   Continue with autocpap 5-15 cm water. DL the last month : 09%, AHI 1.3    Patient was instructed to have mask, tubings, filter, reservoir cleaned at least once a week with soapy water.  Patient was instructed to call the office if he/she is having issues with the PAP device.    I advised patient to obtain sufficient amount of sleep --  7 to 8 hours at least in a 24 hr period.  Patient was advised to follow good sleep hygiene.  Patient was advised NOT to engage in activities requiring concentration and/or vigilance if he/she is and  sleepy.  Patient is NOT to drive if he/she is sleepy.             Return to clinic in 1 year.      Pollie Meyer, MD 12/07/2016   4:15 PM Pulmonary and Critical Care Medicine [facility] Pager: [phone] Office: [phone], Fax: [phone]

## 2017-06-02 ENCOUNTER — Encounter: Payer: Self-pay | Admitting: Gastroenterology

## 2017-06-02 ENCOUNTER — Other Ambulatory Visit (INDEPENDENT_AMBULATORY_CARE_PROVIDER_SITE_OTHER): Payer: 59

## 2017-06-02 ENCOUNTER — Ambulatory Visit (INDEPENDENT_AMBULATORY_CARE_PROVIDER_SITE_OTHER): Payer: 59 | Admitting: Nurse Practitioner

## 2017-06-02 VITALS — BP 130/86 | HR 92 | Ht 69.0 in | Wt 203.4 lb

## 2017-06-02 DIAGNOSIS — R7989 Other specified abnormal findings of blood chemistry: Secondary | ICD-10-CM | POA: Diagnosis not present

## 2017-06-02 DIAGNOSIS — R945 Abnormal results of liver function studies: Secondary | ICD-10-CM | POA: Diagnosis not present

## 2017-06-02 LAB — CBC WITH DIFFERENTIAL/PLATELET
BASOS ABS: 0.1 10*3/uL (ref 0.0–0.1)
Basophils Relative: 0.8 % (ref 0.0–3.0)
EOS ABS: 0.1 10*3/uL (ref 0.0–0.7)
Eosinophils Relative: 2.3 % (ref 0.0–5.0)
HCT: 43.5 % (ref 39.0–52.0)
Hemoglobin: 14.9 g/dL (ref 13.0–17.0)
LYMPHS ABS: 2.1 10*3/uL (ref 0.7–4.0)
Lymphocytes Relative: 34.7 % (ref 12.0–46.0)
MCHC: 34.2 g/dL (ref 30.0–36.0)
MCV: 95 fl (ref 78.0–100.0)
MONO ABS: 0.4 10*3/uL (ref 0.1–1.0)
Monocytes Relative: 6.2 % (ref 3.0–12.0)
NEUTROS ABS: 3.4 10*3/uL (ref 1.4–7.7)
NEUTROS PCT: 56 % (ref 43.0–77.0)
PLATELETS: 202 10*3/uL (ref 150.0–400.0)
RBC: 4.58 Mil/uL (ref 4.22–5.81)
RDW: 12.2 % (ref 11.5–15.5)
WBC: 6.1 10*3/uL (ref 4.0–10.5)

## 2017-06-02 LAB — PROTIME-INR
INR: 1.2 ratio — ABNORMAL HIGH (ref 0.8–1.0)
PROTHROMBIN TIME: 12.5 s (ref 9.6–13.1)

## 2017-06-02 LAB — FERRITIN: FERRITIN: 217.5 ng/mL (ref 22.0–322.0)
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
## 2017-06-02 NOTE — Patient Instructions (Addendum)
You have been scheduled for an abdominal ultrasound at [facility] (1st floor of hospital) on 06-06-17 at 7:00am. Please arrive 15 minutes prior to your appointment for registration. Make certain not to have anything to eat or drink after midnight. Should you need to reschedule your appointment, please contact radiology at [phone]. This test typically takes about 30 minutes to perform.  Your physician has requested that you go to the basement for the following lab work before leaving today: CBC, INR, Ferritin  In 1 to 2 weeks we would like for you to return to the lab to have LFT labs done.  **Please avoid alcohol until further notice.  Thank you

## 2017-06-05 ENCOUNTER — Telehealth: Payer: Self-pay | Admitting: Nurse Practitioner

## 2017-06-06 ENCOUNTER — Ambulatory Visit (HOSPITAL_COMMUNITY)
Admission: RE | Admit: 2017-06-06 | Discharge: 2017-06-06 | Disposition: A | Discharge status: Home / Self Care | Payer: 59 | Source: Ambulatory Visit | Attending: Nurse Practitioner | Admitting: Nurse Practitioner

## 2017-06-06 DIAGNOSIS — R7989 Other specified abnormal findings of blood chemistry: Secondary | ICD-10-CM | POA: Insufficient documentation

## 2017-06-06 DIAGNOSIS — R932 Abnormal findings on diagnostic imaging of liver and biliary tract: Secondary | ICD-10-CM | POA: Insufficient documentation

## 2017-06-06 DIAGNOSIS — R945 Abnormal results of liver function studies: Secondary | ICD-10-CM

## 2017-06-06 NOTE — Progress Notes (Signed)
Reviewed and agree with initial management plan. If LFTs remain chronically elevated send serologies for celiac disease, viral, autoimmune, genetic etiologies.   Venita LickMalcolm T. Russella DarStark, MD [facility]

## 2017-06-16 ENCOUNTER — Other Ambulatory Visit (INDEPENDENT_AMBULATORY_CARE_PROVIDER_SITE_OTHER): Payer: 59

## 2017-06-16 DIAGNOSIS — R945 Abnormal results of liver function studies: Secondary | ICD-10-CM

## 2017-06-16 DIAGNOSIS — R7989 Other specified abnormal findings of blood chemistry: Secondary | ICD-10-CM

## 2017-06-16 LAB — HEPATIC FUNCTION PANEL
ALK PHOS: 57 U/L (ref 39–117)
ALT: 30 U/L (ref 0–53)
AST: 29 U/L (ref 0–37)
Albumin: 4.1 g/dL (ref 3.5–5.2)
Bilirubin, Direct: 0.2 mg/dL (ref 0.0–0.3)
Total Bilirubin: 0.8 mg/dL (ref 0.2–1.2)
Total Protein: 6.6 g/dL (ref 6.0–8.3)

## 2017-09-27 NOTE — Telephone Encounter (Signed)
Done

## 2017-10-17 ENCOUNTER — Encounter (HOSPITAL_COMMUNITY): Payer: Self-pay | Admitting: Psychiatry

## 2017-10-17 ENCOUNTER — Ambulatory Visit (INDEPENDENT_AMBULATORY_CARE_PROVIDER_SITE_OTHER): Payer: 59 | Admitting: Psychiatry

## 2017-10-17 VITALS — BP 126/74 | HR 66 | Ht 69.0 in | Wt 203.6 lb

## 2017-10-17 DIAGNOSIS — Z87891 Personal history of nicotine dependence: Secondary | ICD-10-CM | POA: Diagnosis not present

## 2017-10-17 DIAGNOSIS — G4733 Obstructive sleep apnea (adult) (pediatric): Secondary | ICD-10-CM

## 2017-10-17 DIAGNOSIS — F419 Anxiety disorder, unspecified: Secondary | ICD-10-CM | POA: Diagnosis not present

## 2017-10-17 DIAGNOSIS — Z813 Family history of other psychoactive substance abuse and dependence: Secondary | ICD-10-CM | POA: Diagnosis not present

## 2017-10-17 DIAGNOSIS — Z79899 Other long term (current) drug therapy: Secondary | ICD-10-CM

## 2017-10-17 DIAGNOSIS — F3130 Bipolar disorder, current episode depressed, mild or moderate severity, unspecified: Secondary | ICD-10-CM

## 2017-10-17 DIAGNOSIS — I10 Essential (primary) hypertension: Secondary | ICD-10-CM | POA: Diagnosis not present

## 2017-10-17 DIAGNOSIS — F1010 Alcohol abuse, uncomplicated: Secondary | ICD-10-CM | POA: Diagnosis not present

## 2017-10-17 DIAGNOSIS — F410 Panic disorder [episodic paroxysmal anxiety] without agoraphobia: Secondary | ICD-10-CM

## 2017-10-17 DIAGNOSIS — E785 Hyperlipidemia, unspecified: Secondary | ICD-10-CM | POA: Diagnosis not present

## 2017-10-17 MED ORDER — TRAZODONE HCL 50 MG PO TABS: 50.0000 mg | ORAL_TABLET | Freq: Every evening | ORAL | 0 refills | Status: DC | PRN

## 2017-10-17 MED ORDER — LAMOTRIGINE 25 MG PO TABS: ORAL_TABLET | ORAL | 1 refills | Status: DC

## 2017-10-17 NOTE — Progress Notes (Signed)
Psychiatric Initial Adult Assessment   Patient Identification: Jeffery Green MRN:  [id number] Date of Evaluation:  10/17/2017 Referral Source: Self-referred. Chief Complaint:  I have depression and anxiety. Visit Diagnosis:    ICD-10-CM   1. Bipolar I disorder, most recent episode depressed (HCC) F31.30 lamoTRIgine (LAMICTAL) 25 MG tablet    traZODone (DESYREL) 50 MG tablet    History of Present Illness: Jeffery Green is 36 year old Caucasian employed currently separated man who is self-referred for seeking help.  Patient reported for the past few years he has depression, anxiety, irritability, frustration, mood swing and passive and fleeting suicidal thoughts.  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  He was fired because having issues with the customer and he admitted he got very angry.  He was able to get a better job last year but his depression and anger continue to get worse.  In September his wife left him after 11 years of marriage because of his behavior.  Patient reported he picked up drinking and admitted self-medicating to help sleep and anxiety.  He also endorsed having passive and fleeting suicidal thoughts on and off but denies any intent or any plan.  His wife does not want to reconcile unless he gets better and get help.  Patient admitted that he need to get better for himself.  He has 15 and 70-year-old and he usually see them 3-4 times a week.  He new job seeing Jeffery Green in October for counseling.  Patient admitted mood swings, irritability, road rage, anger, outbursts, trouble sleeping, manic-like symptoms with increased talking, increased energy and restlessness and days when he does not sleep very well.  However he denies any hallucination, nightmares, flashback.  He admitted to having panic attack and anxiety attack and sometime he feels hopeless helpless and worthless.  His energy level is fair.  He lost more than 18 pounds since he started  new job.  His new job requires a lot of moving.  Patient admitted 2-3 beer every day to help his anxiety and sleep.  He denies any withdrawal symptoms including tremors or shakes but admitted blackouts and intoxication.  He denies any other illegal substance use.  He is currently not taking any psychiatric medication but willing to try one to help his mood.  Patient also diagnosis of obstructive sleep apnea but he does not use CPAP machine.  Patient diagnosed hypertension, hyperlipidemia and history of elevated liver enzymes.  However recently his liver enzymes were normal.  He lives by himself.  He has some social network but usually he is isolated withdrawn and spent most time to himself.  Associated Signs/Symptoms: Depression Symptoms:  depressed mood, anhedonia, insomnia, psychomotor agitation, feelings of worthlessness/guilt, difficulty concentrating, hopelessness, recurrent thoughts of death, anxiety, panic attacks, loss of energy/fatigue, disturbed sleep, decreased appetite, (Hypo) Manic Symptoms:  Distractibility, Elevated Mood, Financial Extravagance, Impulsivity, Irritable Mood, Labiality of Mood, Anxiety Symptoms:  Excessive Worry, Panic Symptoms, Psychotic Symptoms:  No psychotic symptoms. PTSD Symptoms: Negative  Past Psychiatric History: Patient denies any history of psychiatric inpatient treatment or any suicidal attempt.  He reported significant history of drug use in his teens.  He admitted using cocaine, marijuana and heavy drinking.  He reported mood swings, irritability, anger issues and highs and lows.  He had never seen psychiatrist or prescribe any medication.  He was fired from his last job because of his behavior.  His wife left him because of his anger issues.  Previous Psychotropic Medications: No   Substance Abuse History in the last 12 months:  Yes.    Consequences of Substance Abuse: Medical Consequences:  Elevated liver enzymes Family Consequences:   Wife left him and lost his job due to his behavior. Blackouts:  Patient reported blackouts  Past Medical History:  Past Medical History:  Diagnosis Date  . Gynecomastia, male   . HLD (hyperlipidemia)   . HTN (hypertension)   . OSA (obstructive sleep apnea)   . PAC (premature atrial contraction)   . Psoriasis     Past Surgical History:  Procedure Laterality Date  . NO PAST SURGERIES      Family Psychiatric History: Reviewed.  Family History:  Family History  Problem Relation Age of Onset  . Lupus Mother   . Heart disease Mother   . Kidney disease Mother   . Heart attack Father   . Drug abuse Father   . Diabetes Maternal Grandfather     Social History:   Social History   Socioeconomic History  . Marital status: Married    Spouse name: None  . Number of children: 2  . Years of education: 6412  . Highest education level: None  Social Needs  . Financial resource strain: None  . Food insecurity - worry: None  . Food insecurity - inability: None  . Transportation needs - medical: None  . Transportation needs - non-medical: None  Occupational History  . Occupation: welder    Employer: [employer]  Tobacco Use  . Smoking status: Former Smoker    Packs/day: 1.50    Years: 18.00    Pack years: 27.00  . Smokeless tobacco: Never Used  . Tobacco comment: uses e-cig and vape 06/02/17  Substance and Sexual Activity  . Alcohol use: Yes    Alcohol/week: 12.6 oz    Types: 21 Cans of beer per week    Comment: 3-4 per day  . Drug use: No  . Sexual activity: None  Other Topics Concern  . None  Social History Narrative   Fun: Systems developerBlacksmithing, working on cars, play with son    Additional Social History: [place]  His parents divorced when he was only 37 years old.  He was raised by his mother.  He see his father once a year.  He is more close to his mother.  Patient married 11 years ago currently he is separated.  He has 2 children who are 933 and 128  years old.  Allergies:  No Known Allergies  Metabolic Disorder Labs: No results found for this or any previous visit (from the past 2160 hour(s)). Lab Results  Component Value Date   HGBA1C 5.6 08/04/2016   No results found for: PROLACTIN No results found for: CHOL, TRIG, HDL, CHOLHDL, VLDL, LDLCALC   Current Medications: Current Outpatient Medications  Medication Sig Dispense Refill  . calcipotriene-betamethasone (TACLONEX) ointment Apply 1 application topically as needed.    . lamoTRIgine (LAMICTAL) 25 MG tablet Take 1 tab daily for 1 week and than 2 tab daily 60 tablet 1  . traZODone (DESYREL) 50 MG tablet Take 1 tablet (50 mg total) by mouth at bedtime as needed for sleep. 30 tablet 0   No current facility-administered medications for this visit.     Neurologic: Headache: No Seizure: No Paresthesias:No  Musculoskeletal: Strength & Muscle Tone: within normal limits Gait & Station: normal Patient leans: N/A  Psychiatric Specialty Exam: Review of Systems  Constitutional: Positive for weight  loss.  HENT: Negative.   Respiratory: Negative.   Cardiovascular: Negative.   Gastrointestinal: Negative.   Genitourinary: Negative.   Musculoskeletal: Negative.   Skin: Negative for itching.       Psoriasis rash  Neurological: Negative.   Psychiatric/Behavioral: Positive for depression and substance abuse. The patient is nervous/anxious and has insomnia.     Blood pressure 126/74, pulse 66, height 5\' 9"  (1.753 m), weight 203 lb 9.6 oz (92.4 kg).Body mass index is 30.07 kg/m.  General Appearance: Fairly Groomed and Long beard  Eye Contact:  Fair  Speech:  Clear and Coherent  Volume:  Normal  Mood:  Anxious, Depressed and Dysphoric  Affect:  Constricted and Depressed  Thought Process:  Goal Directed  Orientation:  Full (Time, Place, and Person)  Thought Content:  Rumination  Suicidal Thoughts:  No  Homicidal Thoughts:  No  Memory:  Immediate;   Good Recent;    Good Remote;   Good  Judgement:  Fair  Insight:  Fair  Psychomotor Activity:  Normal  Concentration:  Concentration: Fair and Attention Span: Fair  Recall:  Good  Fund of Knowledge:Good  Language: Good  Akathisia:  No  Handed:  Right  AIMS (if indicated):  0  Assets:  Communication Skills Desire for Improvement Housing Talents/Skills Transportation  ADL's:  Intact  Cognition: WNL  Sleep: Poor   Assessment: Bipolar disorder, depressed type.  Anxiety disorder NOS.  Panic attacks.  Alcohol abuse.  Plan: I review his symptoms, psychosocial stressors, current medication and recent blood work results.  I had a long discussion with the patient about his illness symptoms and long-term prognosis.  Patient agreed with the diagnosis of bipolar disorder as he experienced highs and lows in his mood.  He agreed to try Lamictal to help his mood swings.  We will try Lamictal 25 mg daily for 1 week and then 50 mg daily.  We discussed that if he develops rash that he need to stop the medication immediately.  We also discussed his alcohol use and interaction with psychiatric medication.  Given the history of elevated liver enzymes I suggested that he should stop alcohol immediately.  I also encouraged that he should use CPAP machine to help insomnia.  I will provide trazodone 50 mg that he can take as needed for insomnia.  Encouraged to continue Jeffery Green for counseling.  We also discussed if needed we may consider CD IOP however patient like to stop alcohol on his own.  Discussed safety concerns at any time having active suicidal thoughts or homicidal thought that he need to call 911 or go to local emergency room.  Follow-up in 3-4 weeks.     Cleotis Nipper, MD 1/22/20199:55 AM

## 2017-11-01 ENCOUNTER — Other Ambulatory Visit (HOSPITAL_COMMUNITY): Payer: Self-pay | Admitting: Psychiatry

## 2017-11-01 ENCOUNTER — Telehealth (HOSPITAL_COMMUNITY): Payer: Self-pay

## 2017-11-01 MED ORDER — ARIPIPRAZOLE 5 MG PO TABS: ORAL_TABLET | ORAL | 0 refills | Status: DC

## 2017-11-01 NOTE — Telephone Encounter (Signed)
Stop Lamictal.  Continue to watch rash until it resolved.  He can start Abilify 5 mg half tablet for 2 days and then full tablet daily.  Please call Abilify to his local pharmacy.

## 2017-11-01 NOTE — Telephone Encounter (Signed)
Patient called and stated that the Lamictal caused a rash to form on his upper thighs, inner- elbows and around his neck. He took benadryl and it is gone now. I advised patient to not take the Lamictal anymore and I would check with you as to a replacement medication. Please review and advise, thank you

## 2017-11-01 NOTE — Telephone Encounter (Signed)
Called the patient and let him know, he will start the Abilify tomorrow.

## 2017-11-13 ENCOUNTER — Other Ambulatory Visit (HOSPITAL_COMMUNITY): Payer: Self-pay | Admitting: Psychiatry

## 2017-11-13 DIAGNOSIS — F3130 Bipolar disorder, current episode depressed, mild or moderate severity, unspecified: Secondary | ICD-10-CM

## 2017-11-28 ENCOUNTER — Ambulatory Visit (HOSPITAL_COMMUNITY): Payer: Self-pay | Admitting: Psychiatry

## 2017-11-28 ENCOUNTER — Other Ambulatory Visit (HOSPITAL_COMMUNITY): Payer: Self-pay | Admitting: Psychiatry

## 2017-11-30 ENCOUNTER — Other Ambulatory Visit (HOSPITAL_COMMUNITY): Payer: Self-pay | Admitting: Psychiatry

## 2017-11-30 ENCOUNTER — Ambulatory Visit: Payer: Self-pay | Admitting: Adult Health

## 2017-11-30 DIAGNOSIS — F3130 Bipolar disorder, current episode depressed, mild or moderate severity, unspecified: Secondary | ICD-10-CM

## 2017-11-30 MED ORDER — ARIPIPRAZOLE 5 MG PO TABS: ORAL_TABLET | ORAL | 0 refills | Status: DC

## 2017-12-06 ENCOUNTER — Encounter (HOSPITAL_COMMUNITY): Payer: Self-pay | Admitting: Psychiatry

## 2017-12-06 ENCOUNTER — Ambulatory Visit (INDEPENDENT_AMBULATORY_CARE_PROVIDER_SITE_OTHER): Payer: 59 | Admitting: Psychiatry

## 2017-12-06 DIAGNOSIS — Z87891 Personal history of nicotine dependence: Secondary | ICD-10-CM

## 2017-12-06 DIAGNOSIS — F1099 Alcohol use, unspecified with unspecified alcohol-induced disorder: Secondary | ICD-10-CM | POA: Diagnosis not present

## 2017-12-06 DIAGNOSIS — R450 Nervousness: Secondary | ICD-10-CM | POA: Diagnosis not present

## 2017-12-06 DIAGNOSIS — F410 Panic disorder [episodic paroxysmal anxiety] without agoraphobia: Secondary | ICD-10-CM

## 2017-12-06 DIAGNOSIS — Z813 Family history of other psychoactive substance abuse and dependence: Secondary | ICD-10-CM

## 2017-12-06 DIAGNOSIS — F419 Anxiety disorder, unspecified: Secondary | ICD-10-CM

## 2017-12-06 DIAGNOSIS — F3130 Bipolar disorder, current episode depressed, mild or moderate severity, unspecified: Secondary | ICD-10-CM

## 2017-12-06 MED ORDER — ARIPIPRAZOLE 10 MG PO TABS: ORAL_TABLET | ORAL | 1 refills | Status: DC

## 2017-12-06 NOTE — Progress Notes (Signed)
BH MD/PA/NP OP Progress Note  12/06/2017 11:06 AM Jeffery Green  MRN:  [id number]  Chief Complaint: I like new medication.  I still feel anxious sometimes.  HPI: Patient is 37 year old Caucasian, employed currently separated man who was seen first time on January 22 for seeking help.  Patient had a history of irritability, anger, mood swing, highs and lows and recently started to have passive and fleeting suicidal thoughts.  His wife left him after 11 years of marriage because of his anger issues.  Patient picked up drinking and admitted self-medicating to help with sleep and anxiety.  We started him on Lamictal however he developed rash and we told him to stop.  We tried Abilify and he is taking 5 mg daily.  He is seen improvement in his irritability, anger, mood swing and energy level.  He is no longer having any suicidal thoughts but he still feel anxious and easily frustrated.  He feels more frustrated when he see his kids.  He has 243 and 39-year-old and he usually see them 3-4 times a week.  Patient admitted noncompliant with [facility] for therapy and also not using CPAP machine.  Also noncompliant with blood work.  He has a high liver enzyme.  He admitted cut down his drinking significantly but he still drinks 2-3 beers to 3 times a week.  But he does not get drunk and intoxicated.  He is sleeping better and really takes trazodone.  He has no tremors, shakes or any EPS.  He likes his job.  His appetite is improved.  Patient is not interested in AA and enjoy beer once in a while.  He realized if his liver enzymes continue to be high then he need to stop the drinking immediately.  Patient has hypertension, hyperlipidemia and history of elevated liver enzymes.  Patient lives by himself.  Visit Diagnosis:    ICD-10-CM   1. Bipolar I disorder, most recent episode depressed (HCC) F31.30 ARIPiprazole (ABILIFY) 10 MG tablet    Past Psychiatric History: Reviewed. Patient denies any history of  psychiatric inpatient treatment or any suicidal attempt.  He had a history of drug use in his teens.  He admitted using cocaine, marijuana and heavy drinking.  He had a history of mood swings, irritability, anger, highs and lows.  His wife left him because of his anger issues.  We tried him on Lamictal but causes rash.  Past Medical History:  Past Medical History:  Diagnosis Date  . Gynecomastia, male   . HLD (hyperlipidemia)   . HTN (hypertension)   . OSA (obstructive sleep apnea)   . PAC (premature atrial contraction)   . Psoriasis     Past Surgical History:  Procedure Laterality Date  . NO PAST SURGERIES      Family Psychiatric History: Reviewed.  Family History:  Family History  Problem Relation Age of Onset  . Lupus Mother   . Heart disease Mother   . Kidney disease Mother   . Heart attack Father   . Drug abuse Father   . Diabetes Maternal Grandfather     Social History:  Social History   Socioeconomic History  . Marital status: Married    Spouse name: Not on file  . Number of children: 2  . Years of education: 4112  . Highest education level: Not on file  Social Needs  . Financial resource strain: Not hard at all  . Food insecurity - worry: Never true  . Food insecurity - inability:  Never true  . Transportation needs - medical: No  . Transportation needs - non-medical: No  Occupational History  . Occupation: welder    Employer: [employer]  Tobacco Use  . Smoking status: Former Smoker    Packs/day: 1.50    Years: 18.00    Pack years: 27.00  . Smokeless tobacco: Never Used  . Tobacco comment: uses e-cig and vape 06/02/17  Substance and Sexual Activity  . Alcohol use: Yes    Alcohol/week: 12.6 oz    Types: 21 Cans of beer per week    Comment: 3-4 per day/ trying to quit  . Drug use: No  . Sexual activity: No  Other Topics Concern  . 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 Lamictal [Lamotrigine] Rash    Metabolic Disorder Labs: Lab Results  Component Value Date   HGBA1C 5.6 08/04/2016   No results found for: PROLACTIN No results found for: CHOL, TRIG, HDL, CHOLHDL, VLDL, LDLCALC Lab Results  Component Value Date   TSH 2.31 08/04/2016    Therapeutic Level Labs: No results found for: LITHIUM No results found for: VALPROATE No components found for:  CBMZ  Current Medications: Current Outpatient Medications  Medication Sig Dispense Refill  . ARIPiprazole (ABILIFY) 5 MG tablet Take 1 tab daily 30 tablet 0  . calcipotriene-betamethasone (TACLONEX) ointment Apply 1 application topically as needed.    . traZODone (DESYREL) 50 MG tablet TAKE 1 TABLET BY MOUTH AT BEDTIME AS NEEDED FOR SLEEP. 30 tablet 0   No current facility-administered medications for this visit.      Musculoskeletal: Strength & Muscle Tone: within normal limits Gait & Station: normal Patient leans: N/A  Psychiatric Specialty Exam: Review of Systems  Constitutional: Negative.   HENT: Negative.   Respiratory: Negative.   Cardiovascular: Negative.   Genitourinary: Negative.   Musculoskeletal: Negative.   Skin:       Tattoos on his arm  Neurological: Negative.   Psychiatric/Behavioral: Positive for substance abuse. The patient is nervous/anxious.     Blood pressure 124/70, pulse 75, height 5\' 9"  (1.753 m), weight 205 lb (93 kg).There is no height or weight on file to calculate BMI.  General Appearance: Fairly Groomed and Long beard and have tattoos on his arms.  Eye Contact:  Good  Speech:  Clear and Coherent  Volume:  Normal  Mood:  Anxious  Affect:  Congruent  Thought Process:  Goal Directed  Orientation:  Full (Time, Place, and Person)  Thought Content: Rumination   Suicidal Thoughts:  No  Homicidal Thoughts:  No  Memory:  Immediate;   Fair Recent;   Fair Remote;   Fair  Judgement:  Fair  Insight:  Good  Psychomotor Activity:  Normal   Concentration:  Concentration: Fair and Attention Span: Fair  Recall:  [employer] of Knowledge: Good  Language: Good  Akathisia:  No  Handed:  Right  AIMS (if indicated): not done  Assets:  Communication Skills Desire for Improvement Housing Resilience  ADL's:  Intact  Cognition: WNL  Sleep:  Improved   Screenings:   Assessment and Plan: Bipolar disorder, depressed type.  Anxiety disorder NOS.  Panic attacks.  Alcohol abuse.  Discontinue Lamictal, patient is no longer taking it due to rash.  Patient feeling better with Abilify and I recommended to try 10 mg to help his anxiety and mood.  We  discussed stopping alcohol due to elevated liver enzymes and interaction with psychotropic medication.  Patient is scheduled to have blood work and he promised to have it done very soon.  I also reminded he should use CPAP for better sleep.  He does not take trazodone every night because he noticed his sleep is improved from the past.  Overall he like the Abilify.  He has no tremors, shakes or any EPS.  I encouraged to restart therapy with [name]. Patient is not interested to take any medication for his alcohol use.  Discussed safety concerns at any time having active suicidal thoughts or homicidal thought that he need to call 911 or go to local emergency room.  Follow-up in 2 months.  Time spent 25 minutes.  More than 50% of the time spent in psychoeducation, counseling and coordination of care.   Cleotis Nipper, MD 12/06/2017, 11:06 AM

## 2017-12-27 ENCOUNTER — Other Ambulatory Visit (HOSPITAL_COMMUNITY): Payer: Self-pay | Admitting: Psychiatry

## 2017-12-27 DIAGNOSIS — F3130 Bipolar disorder, current episode depressed, mild or moderate severity, unspecified: Secondary | ICD-10-CM

## 2018-01-06 ENCOUNTER — Other Ambulatory Visit (HOSPITAL_COMMUNITY): Payer: Self-pay | Admitting: Psychiatry

## 2018-01-06 DIAGNOSIS — F3130 Bipolar disorder, current episode depressed, mild or moderate severity, unspecified: Secondary | ICD-10-CM

## 2018-01-08 ENCOUNTER — Other Ambulatory Visit (HOSPITAL_COMMUNITY): Payer: Self-pay

## 2018-01-08 DIAGNOSIS — F3130 Bipolar disorder, current episode depressed, mild or moderate severity, unspecified: Secondary | ICD-10-CM

## 2018-01-08 MED ORDER — ARIPIPRAZOLE 10 MG PO TABS: ORAL_TABLET | ORAL | 0 refills | Status: DC

## 2018-02-05 ENCOUNTER — Other Ambulatory Visit (HOSPITAL_COMMUNITY): Payer: Self-pay | Admitting: Psychiatry

## 2018-02-05 DIAGNOSIS — F3130 Bipolar disorder, current episode depressed, mild or moderate severity, unspecified: Secondary | ICD-10-CM

## 2018-02-07 ENCOUNTER — Encounter (HOSPITAL_COMMUNITY): Payer: Self-pay | Admitting: Psychiatry

## 2018-02-07 ENCOUNTER — Ambulatory Visit (HOSPITAL_COMMUNITY): Payer: 59 | Admitting: Psychiatry

## 2018-02-07 DIAGNOSIS — F1099 Alcohol use, unspecified with unspecified alcohol-induced disorder: Secondary | ICD-10-CM | POA: Diagnosis not present

## 2018-02-07 DIAGNOSIS — Z87891 Personal history of nicotine dependence: Secondary | ICD-10-CM | POA: Diagnosis not present

## 2018-02-07 DIAGNOSIS — F419 Anxiety disorder, unspecified: Secondary | ICD-10-CM | POA: Diagnosis not present

## 2018-02-07 DIAGNOSIS — F3130 Bipolar disorder, current episode depressed, mild or moderate severity, unspecified: Secondary | ICD-10-CM | POA: Diagnosis not present

## 2018-02-07 DIAGNOSIS — Z813 Family history of other psychoactive substance abuse and dependence: Secondary | ICD-10-CM | POA: Diagnosis not present

## 2018-02-07 MED ORDER — TRAZODONE HCL 50 MG PO TABS: 50.0000 mg | ORAL_TABLET | Freq: Every evening | ORAL | 0 refills | Status: AC | PRN

## 2018-02-07 MED ORDER — ARIPIPRAZOLE 10 MG PO TABS: ORAL_TABLET | ORAL | 0 refills | Status: AC

## 2018-02-07 NOTE — Progress Notes (Signed)
BH MD/PA/NP OP Progress Note  02/07/2018 10:35 AM Jeffery Green  MRN:  [id number]  Chief Complaint: I am doing much better with Abilify.  HPI: Jeffery Green came for his follow-up appointment.  He is taking Abilify 5 mg in the morning and 5 mg at bedtime.  He tried to take 10 mg in the morning but he could not tolerate the dose.  He is doing much better on Abilify.  Some days he takes trazodone to help sleep.  He denies any irritability, anger, mania or any psychosis.  He also noticed his anxiety is very less.  He continues to visit his 17 and 29-year-old 2-3 times a week.  He is disappointed because his wife does not want to reconcile.  However he feels overall his mood and anger is under control.  He cut down drinking significantly and only drinks 1 to 2 glass a week.  He denies any binge or any intoxication.  He is no longer seeing therapist as he feels does not need it.  He likes his job.  He denies any paranoia, hallucination or any mania.  His energy level is good.  His appetite is okay.  Visit Diagnosis:    ICD-10-CM   1. Bipolar I disorder, most recent episode depressed (HCC) F31.30 ARIPiprazole (ABILIFY) 10 MG tablet    traZODone (DESYREL) 50 MG tablet    Past Psychiatric History: Reviewed. Patient denies any history of psychiatric inpatient treatment or any suicidal attempt.  He had a history of drug use. He admitted using cocaine, marijuana and heavy drinking.  He had a history of mood swings, irritability, anger, highs and lows.  His wife left him because of his anger issues.  We tried him on Lamictal but causes rash.  Past Medical History:  Past Medical History:  Diagnosis Date  . Gynecomastia, male   . HLD (hyperlipidemia)   . HTN (hypertension)   . OSA (obstructive sleep apnea)   . PAC (premature atrial contraction)   . Psoriasis     Past Surgical History:  Procedure Laterality Date  . NO PAST SURGERIES      Family Psychiatric History: Reviewed.  Family History:  Family  History  Problem Relation Age of Onset  . Lupus Mother   . Heart disease Mother   . Kidney disease Mother   . Heart attack Father   . Drug abuse Father   . Diabetes Maternal Grandfather     Social History:  Social History   Socioeconomic History  . Marital status: Married    Spouse name: Not on file  . Number of children: 2  . Years of education: 59  . Highest education level: Not on file  Occupational History  . Occupation: welder    Employer: [employer]  Social Needs  . Financial resource strain: Not hard at all  . Food insecurity:    Worry: Never true    Inability: Never true  . Transportation needs:    Medical: No    Non-medical: No  Tobacco Use  . Smoking status: Former Smoker    Packs/day: 1.50    Years: 18.00    Pack years: 27.00  . Smokeless tobacco: Never Used  . Tobacco comment: uses e-cig and vape 06/02/17  Substance and Sexual Activity  . Alcohol use: Yes    Alcohol/week: 8.4 oz    Types: 14 Cans of beer per week    Comment: 2 a day / trying to cut back  . Drug use: No  .  Sexual activity: Never  Lifestyle  . Physical activity:    Days per week: 0 days    Minutes per session: 0 min  . Stress: Rather much  Relationships  . Social connections:    Talks on phone: More than three times a week    Gets together: Once a week    Attends religious service: Never    Active member of club or organization: No    Attends meetings of clubs or organizations: Never    Relationship status: Separated  Other Topics Concern  . 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 Lamictal [Lamotrigine] Rash    Metabolic Disorder Labs: Lab Results  Component Value Date   HGBA1C 5.6 08/04/2016   No results found for: PROLACTIN No results found for: CHOL, TRIG, HDL, CHOLHDL, VLDL, LDLCALC Lab Results  Component Value Date   TSH 2.31 08/04/2016    Therapeutic Level  Labs: No results found for: LITHIUM No results found for: VALPROATE No components found for:  CBMZ  Current Medications: Current Outpatient Medications  Medication Sig Dispense Refill  . ARIPiprazole (ABILIFY) 10 MG tablet Take 1 tab daily 30 tablet 0  . calcipotriene-betamethasone (TACLONEX) ointment Apply 1 application topically as needed.    . traZODone (DESYREL) 50 MG tablet TAKE 1 TABLET BY MOUTH AT BEDTIME AS NEEDED FOR SLEEP. 30 tablet 0   No current facility-administered medications for this visit.      Musculoskeletal: Strength & Muscle Tone: within normal limits Gait & Station: normal Patient leans: N/A  Psychiatric Specialty Exam: ROS  Blood pressure 128/74, pulse 75, height  (1.753 m), weight 211 lb (95.7 kg).Body mass index is 31.16 kg/m.  General Appearance: Casual and tatoo in his hand  Eye Contact:  Good  Speech:  Clear and Coherent  Volume:  Normal  Mood:  Euthymic  Affect:  Appropriate  Thought Process:  Goal Directed  Orientation:  Full (Time, Place, and Person)  Thought Content: Logical   Suicidal Thoughts:  No  Homicidal Thoughts:  No  Memory:  Immediate;   Good Recent;   Good Remote;   Good  Judgement:  Good  Insight:  Good  Psychomotor Activity:  Normal  Concentration:  Concentration: Good and Attention Span: Good  Recall:  Good  Fund of Knowledge: Good  Language: Good  Akathisia:  No  Handed:  Right  AIMS (if indicated): not done  Assets:  Communication Skills Desire for Improvement Housing Resilience Social Support  ADL's:  Intact  Cognition: WNL  Sleep:  improved   Screenings:   Assessment and Plan: Bipolar disorder, depressed type.  Anxiety disorder NOS.  Alcohol abuse.  Patient doing better on Abilify.  He has no more anger and he cut down significantly he is drinking.  Patient is not interested in counseling.  Discussed medication side effects and interaction with alcohol.  Patient is working to completely stop his  drinking.  Continue Abilify 10 mg daily and trazodone 50 mg as needed for insomnia.  Recommended to call us back if he has any question or any concern.  Follow-up in 3 months   Cleotis Nipper, MD 02/07/2018, 10:35 AM

## 2018-05-11 ENCOUNTER — Ambulatory Visit (HOSPITAL_COMMUNITY): Payer: Self-pay | Admitting: Psychiatry

## 2018-07-03 ENCOUNTER — Ambulatory Visit (HOSPITAL_COMMUNITY): Payer: Self-pay | Admitting: Psychiatry

## 2018-10-20 IMAGING — US US ABDOMEN LIMITED
1 series · 14 of 25 positions shown · non-contrast
Comparison: None.

CLINICAL DATA: Increased LFTs.

EXAM:
ULTRASOUND ABDOMEN LIMITED RIGHT UPPER QUADRANT

[Series 1: us abdomen limited · 0.22mm/px · 14 of 63 slices shown]
[im 1/63]
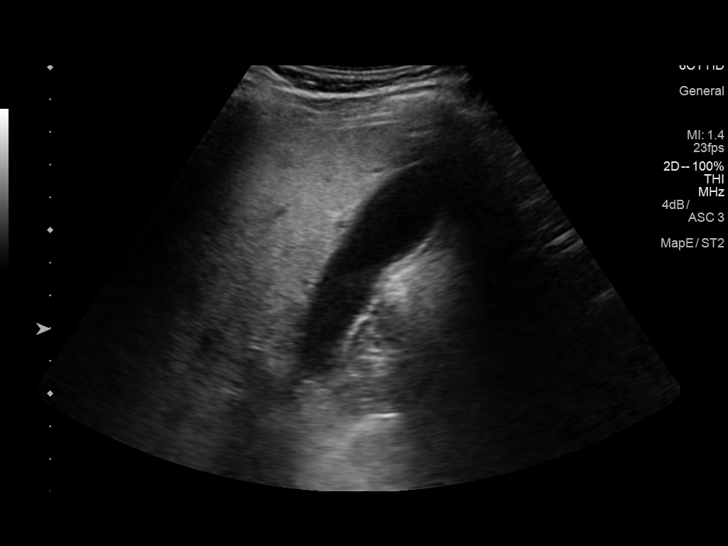
[im 6/63]
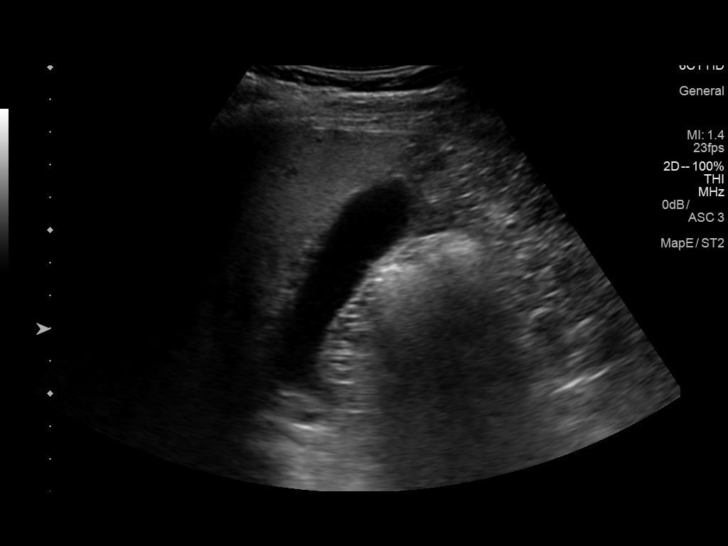
[im 11/63]
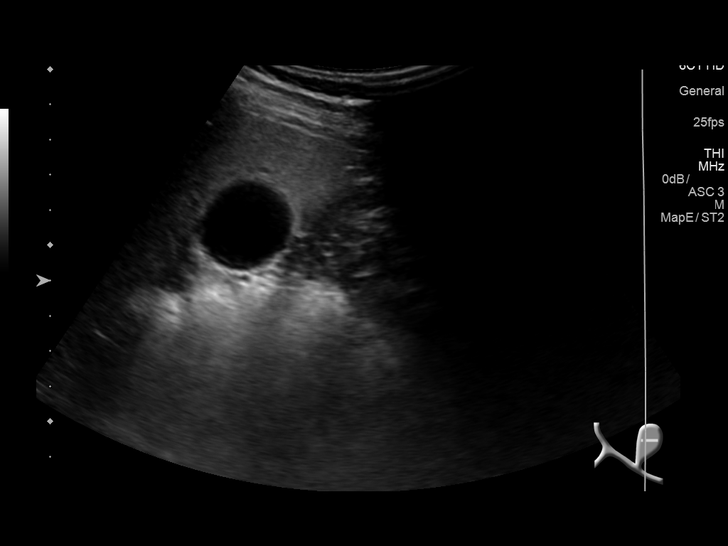
[im 16/63]
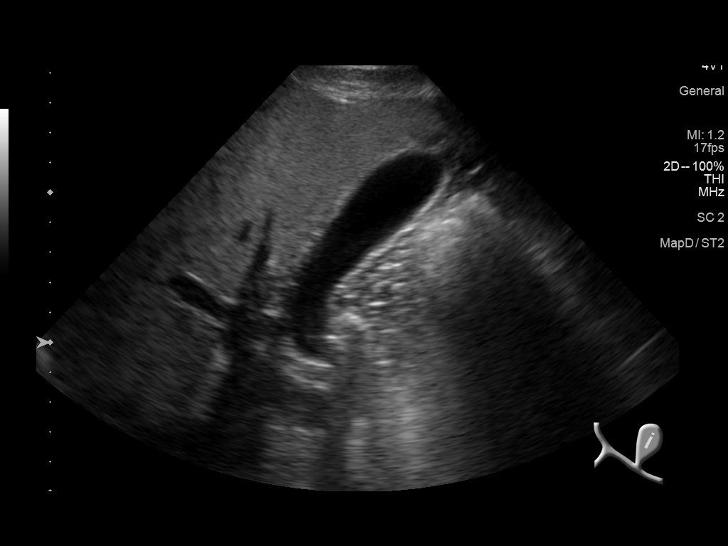
[im 21/63]
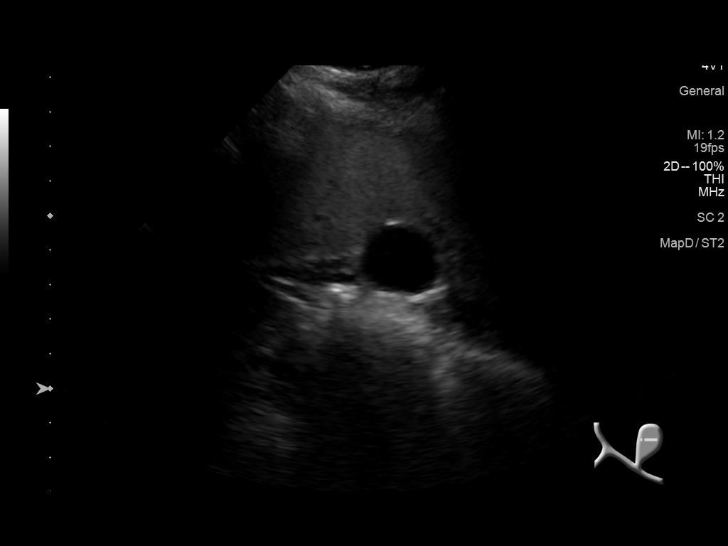
[im 24/63]
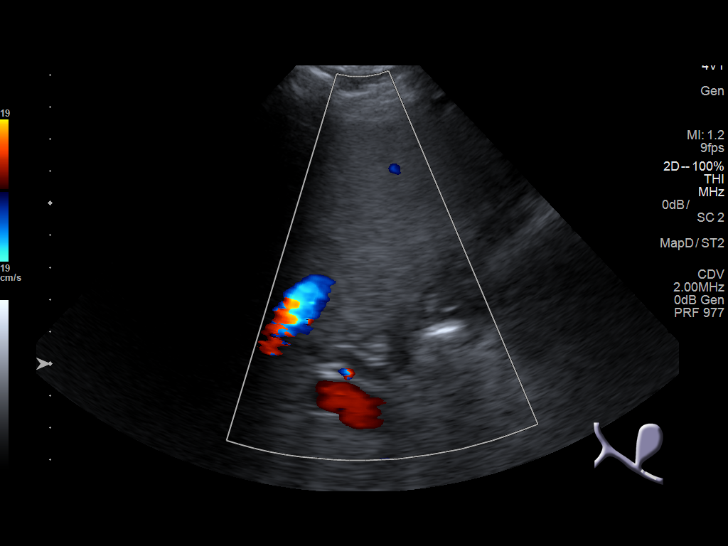
[im 29/63]
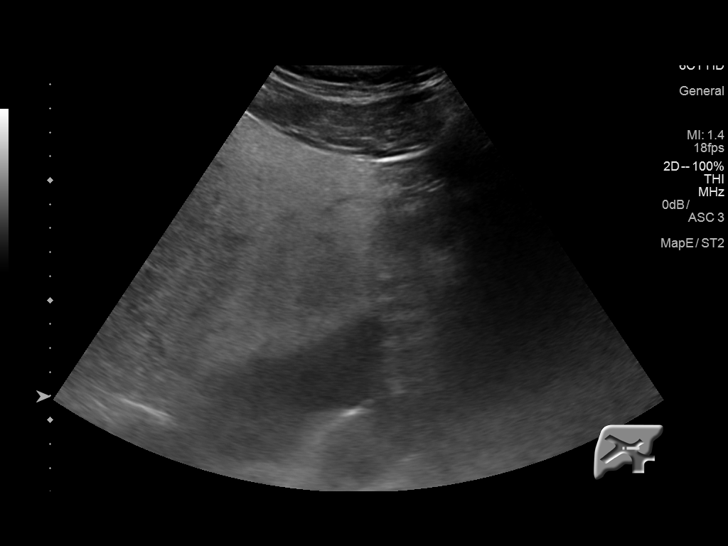
[im 34/63]
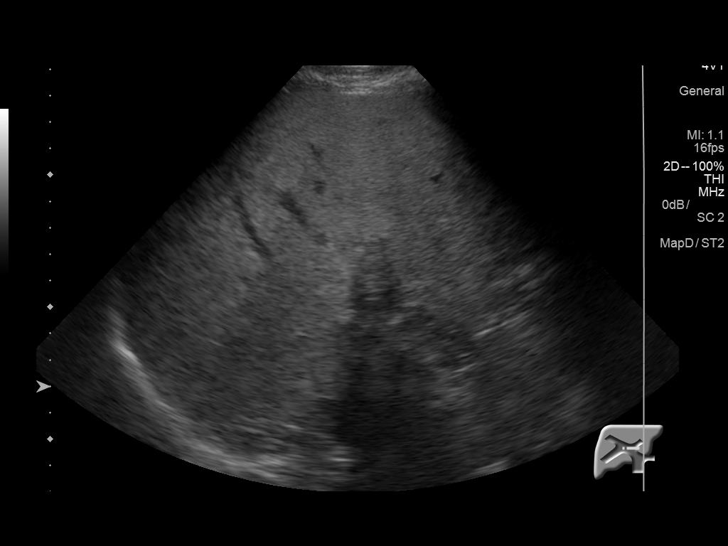
[im 39/63]
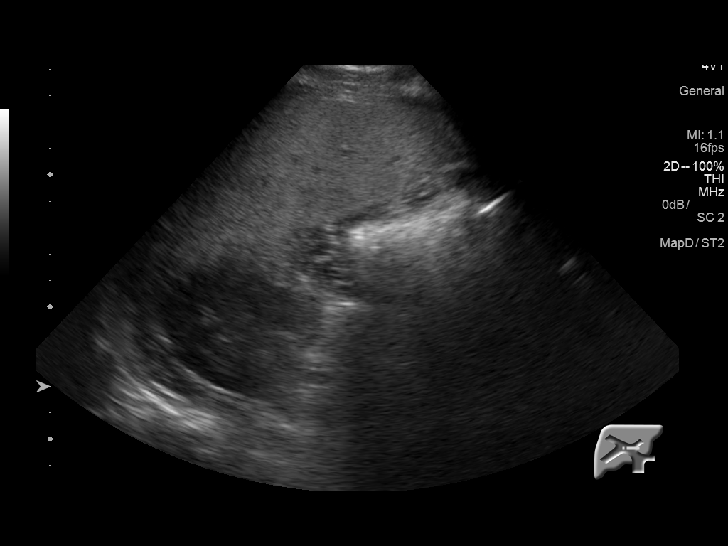
[im 42/63]
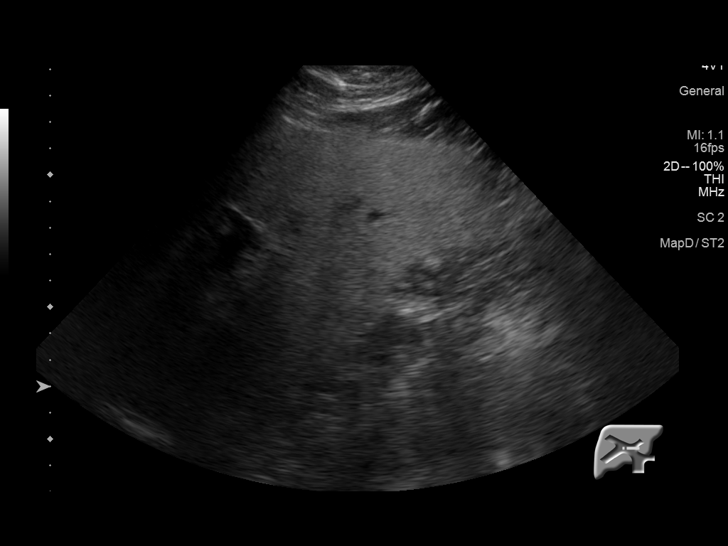
[im 47/63]
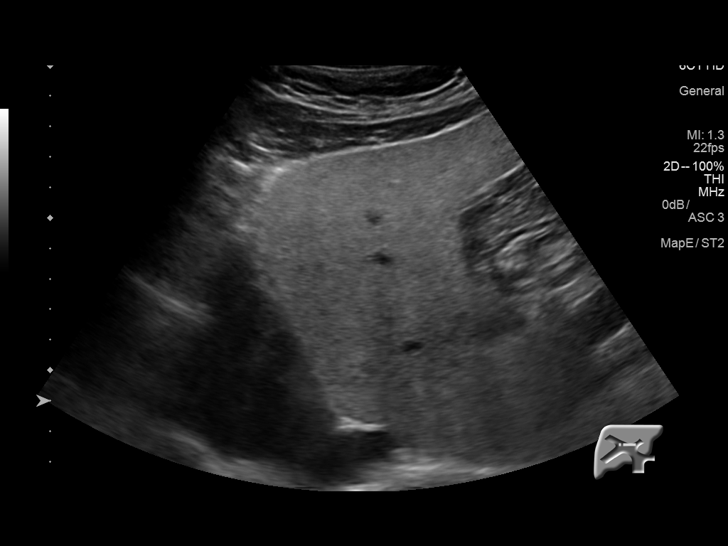
[im 52/63]
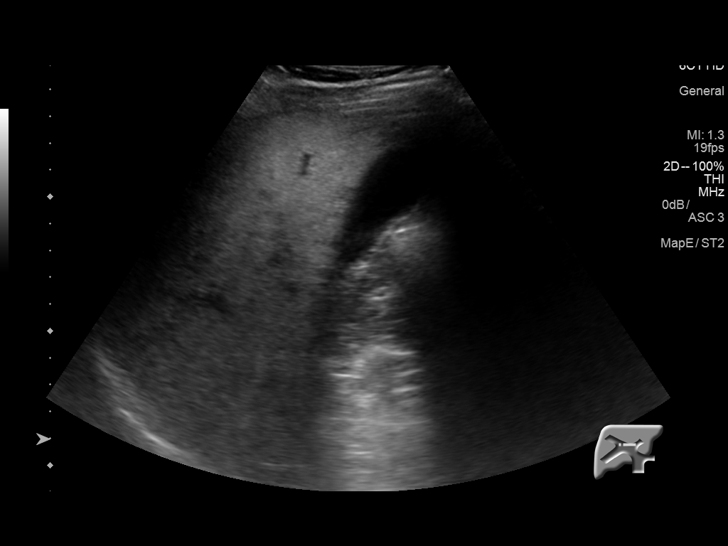
[im 57/63]
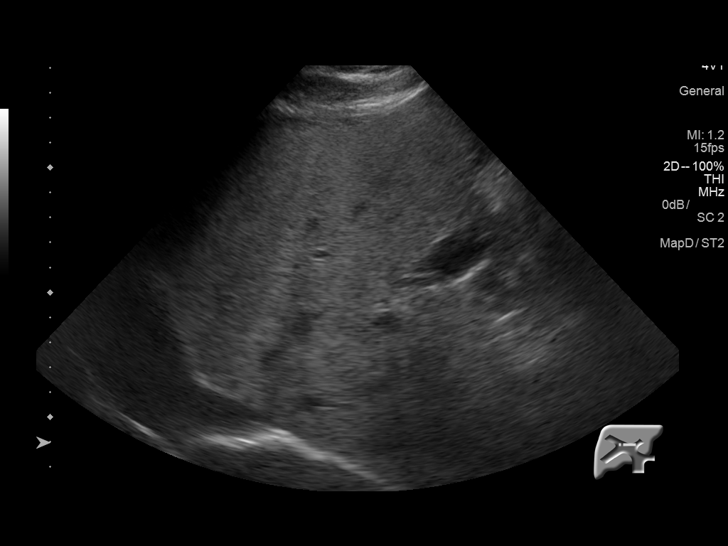
[im 63/63]
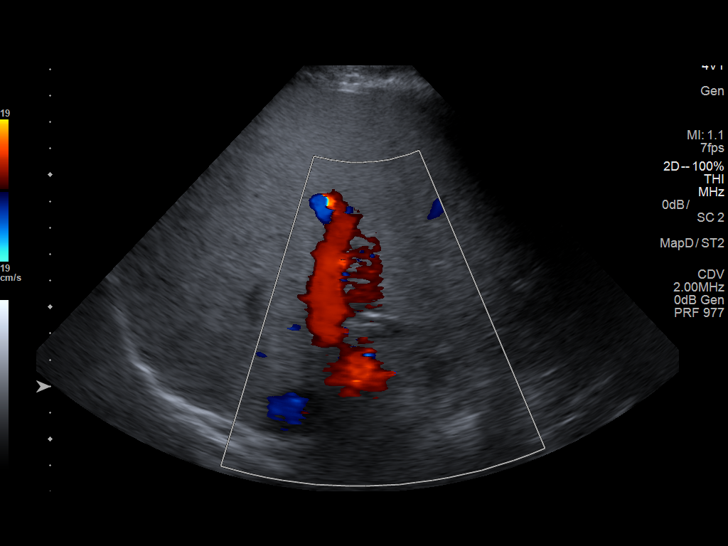

[14 of 25 positions shown; findings below may reference images not displayed]

FINDINGS: Gallbladder:

No gallstones or wall thickening visualized. No sonographic Murphy
sign noted by sonographer. Wall thickness is within normal limits at
2.1 mm.

Common bile duct:

Diameter: 5.2 mm, within normal limits.

Liver:

The liver is diffusely echogenic. No focal lesions are present.
There is no significant nodularity to the liver surface. Portal vein
is patent on color Doppler imaging with normal direction of blood
flow towards the liver.
IMPRESSION: 1. Increased echogenicity of the liver suggesting hepatic steatosis.
2. No focal hepatic lesions.
3. Normal appearance of the common bile duct and gallbladder.

## 2019-08-19 ENCOUNTER — Emergency Department (HOSPITAL_COMMUNITY): Payer: 59

## 2019-08-19 ENCOUNTER — Emergency Department (HOSPITAL_COMMUNITY)
Admission: EM | Admit: 2019-08-19 | Discharge: 2019-08-19 | Disposition: A | Discharge status: Home / Self Care | Payer: 59 | Attending: Emergency Medicine | Admitting: Emergency Medicine

## 2019-08-19 ENCOUNTER — Other Ambulatory Visit: Payer: Self-pay

## 2019-08-19 ENCOUNTER — Encounter (HOSPITAL_COMMUNITY): Payer: Self-pay

## 2019-08-19 DIAGNOSIS — Z79899 Other long term (current) drug therapy: Secondary | ICD-10-CM | POA: Insufficient documentation

## 2019-08-19 DIAGNOSIS — R531 Weakness: Secondary | ICD-10-CM | POA: Diagnosis present

## 2019-08-19 DIAGNOSIS — E860 Dehydration: Secondary | ICD-10-CM

## 2019-08-19 DIAGNOSIS — Z87891 Personal history of nicotine dependence: Secondary | ICD-10-CM | POA: Insufficient documentation

## 2019-08-19 DIAGNOSIS — I10 Essential (primary) hypertension: Secondary | ICD-10-CM | POA: Insufficient documentation

## 2019-08-19 LAB — CBC WITH DIFFERENTIAL/PLATELET
Abs Immature Granulocytes: 0.01 10*3/uL (ref 0.00–0.07)
Basophils Absolute: 0.1 10*3/uL (ref 0.0–0.1)
Basophils Relative: 1 %
Eosinophils Absolute: 0.1 10*3/uL (ref 0.0–0.5)
Eosinophils Relative: 1 %
HCT: 47.6 % (ref 39.0–52.0)
Hemoglobin: 16.1 g/dL (ref 13.0–17.0)
Immature Granulocytes: 0 %
Lymphocytes Relative: 21 %
Lymphs Abs: 1.5 10*3/uL (ref 0.7–4.0)
MCH: 33.6 pg (ref 26.0–34.0)
MCHC: 33.8 g/dL (ref 30.0–36.0)
MCV: 99.4 fL (ref 80.0–100.0)
Monocytes Absolute: 0.5 10*3/uL (ref 0.1–1.0)
Monocytes Relative: 7 %
Neutro Abs: 4.8 10*3/uL (ref 1.7–7.7)
Neutrophils Relative %: 70 %
Platelets: 187 10*3/uL (ref 150–400)
RBC: 4.79 MIL/uL (ref 4.22–5.81)
RDW: 11.5 % (ref 11.5–15.5)
WBC: 6.8 10*3/uL (ref 4.0–10.5)
nRBC: 0 % (ref 0.0–0.2)

## 2019-08-19 LAB — BASIC METABOLIC PANEL
Anion gap: 12 (ref 5–15)
BUN: 8 mg/dL (ref 6–20)
CO2: 26 mmol/L (ref 22–32)
Calcium: 9.4 mg/dL (ref 8.9–10.3)
Chloride: 99 mmol/L (ref 98–111)
Creatinine, Ser: 0.83 mg/dL (ref 0.61–1.24)
GFR calc Af Amer: >60 mL/min (ref >60)
GFR calc non Af Amer: >60 mL/min (ref >60)
Glucose, Bld: 159 mg/dL — ABNORMAL HIGH (ref 70–99)
Potassium: 4 mmol/L (ref 3.5–5.1)
Sodium: 137 mmol/L (ref 135–145)

## 2019-08-19 MED ORDER — SODIUM CHLORIDE 0.9 % IV BOLUS
1000.0000 mL | Freq: Once | INTRAVENOUS | Status: AC
  Administered 2019-08-19: 1000 mL via INTRAVENOUS

## 2019-08-19 NOTE — ED Provider Notes (Signed)
[facility] DEPT Provider Note   CSN: [id number] Arrival date & time: 08/19/19  2595     History   Chief Complaint Chief Complaint  Patient presents with  . Weakness    HPI Jeffery Green is a 38 y.o. male.     The history is provided by the patient.  Weakness Severity:  Mild Onset quality:  Gradual Timing:  Constant Progression:  Unchanged Chronicity:  New Context: alcohol use   Relieved by:  Nothing Worsened by:  Nothing Associated symptoms: no abdominal pain, no arthralgias, no chest pain, no cough, no dysuria, no fever, no seizures, no shortness of breath and no vomiting   Risk factors: no coronary artery disease     Past Medical History:  Diagnosis Date  . Gynecomastia, male   . HLD (hyperlipidemia)   . HTN (hypertension)   . OSA (obstructive sleep apnea)   . PAC (premature atrial contraction)   . Psoriasis     Patient Active Problem List   Diagnosis Date Noted  . OSA (obstructive sleep apnea) 10/21/2016  . Hypersomnia 08/11/2016  . Fatigue 08/04/2016  . Memory difficulty 08/04/2016    Past Surgical History:  Procedure Laterality Date  . NO PAST SURGERIES          Home Medications    Prior to Admission medications   Medication Sig Start Date End Date Taking? Authorizing Provider  ARIPiprazole (ABILIFY) 10 MG tablet Take 1 tab daily 02/07/18   Arfeen, Arlyce Harman, MD  calcipotriene-betamethasone (TACLONEX) ointment Apply 1 application topically as needed.    [provider]  traZODone (DESYREL) 50 MG tablet Take 1 tablet (50 mg total) by mouth at bedtime as needed. for sleep 02/07/18   Arfeen, Arlyce Harman, MD    Family History Family History  Problem Relation Age of Onset  . Lupus Mother   . Heart disease Mother   . Kidney disease Mother   . Heart attack Father   . Drug abuse Father   . Diabetes Maternal Grandfather     Social History Social History   Tobacco Use  . Smoking status: Former Smoker    Packs/day: 1.50    Years: 18.00    Pack years: 27.00  . Smokeless tobacco: Never Used  . Tobacco comment: uses e-cig and vape 06/02/17  Substance Use Topics  . Alcohol use: Yes    Alcohol/week: 14.0 standard drinks    Types: 14 Cans of beer per week    Comment: 2 a day / trying to cut back  . Drug use: No     Allergies   Lamictal [lamotrigine]   Review of Systems Review of Systems  Constitutional: Positive for fatigue (feels dehyradrated). Negative for chills and fever.  HENT: Negative for ear pain and sore throat.   Eyes: Negative for pain and visual disturbance.  Respiratory: Negative for cough and shortness of breath.   Cardiovascular: Negative for chest pain and palpitations.  Gastrointestinal: Negative for abdominal pain and vomiting.  Genitourinary: Negative for dysuria and hematuria.  Musculoskeletal: Negative for arthralgias and back pain.  Skin: Negative for color change and rash.  Neurological: Positive for weakness. Negative for seizures and syncope.  All other systems reviewed and are negative.    Physical Exam Updated Vital Signs  ED Triage Vitals  Enc Vitals Group     BP 08/19/19 0949 (!) 180/99     Pulse Rate 08/19/19 0949 76     Resp 08/19/19 0951 18  Temp 08/19/19 0949 97.7 F (36.5 C)     Temp Source 08/19/19 0949 Oral     SpO2 08/19/19 0949 99 %     Weight --      Height --      Head Circumference --      Peak Flow --      Pain Score 08/19/19 0951 0     Pain Loc --      Pain Edu? --      Excl. in GC? --     Physical Exam Vitals signs and nursing note reviewed.  Constitutional:      General: He is not in acute distress.    Appearance: He is well-developed. He is not ill-appearing.  HENT:     Head: Normocephalic and atraumatic.     Mouth/Throat:     Mouth: Mucous membranes are dry.  Eyes:     Conjunctiva/sclera: Conjunctivae normal.  Neck:     Musculoskeletal: Normal range of motion and neck supple.  Cardiovascular:     Rate and  Rhythm: Normal rate and regular rhythm.     Pulses: Normal pulses.     Heart sounds: Normal heart sounds. No murmur.  Pulmonary:     Effort: Pulmonary effort is normal. No respiratory distress.     Breath sounds: Normal breath sounds.  Abdominal:     Palpations: Abdomen is soft.     Tenderness: There is no abdominal tenderness.  Musculoskeletal: Normal range of motion.  Skin:    General: Skin is warm and dry.     Capillary Refill: Capillary refill takes less than 2 seconds.  Neurological:     General: No focal deficit present.     Mental Status: He is alert.  Psychiatric:        Mood and Affect: Mood normal.      ED Treatments / Results  Labs (all labs ordered are listed, but only abnormal results are displayed) Labs Reviewed  BASIC METABOLIC PANEL - Abnormal; Notable for the following components:      Result Value   Glucose, Bld 159 (*)    All other components within normal limits  CBC WITH DIFFERENTIAL/PLATELET    EKG EKG Interpretation  Date/Time:  Monday August 19 2019 09:56:25 EST Ventricular Rate:  99 PR Interval:    QRS Duration: 103 QT Interval:  369 QTC Calculation: 474 R Axis:   38 Text Interpretation: Sinus rhythm RSR' in V1 or V2, probably normal variant Confirmed by Virgina NorfolkAdam, Dajanay Northrup [phone]([id number]) on 08/19/2019 9:58:55 AM   Radiology Dg Chest Portable 1 View  Result Date: 08/19/2019 CLINICAL DATA:  Weakness, chills EXAM: PORTABLE CHEST 1 VIEW COMPARISON:  09/03/2008 FINDINGS: Lungs are clear.  No pleural effusion or pneumothorax. The heart is normal in size. IMPRESSION: No evidence of acute cardiopulmonary disease. Electronically Signed   By: Charline BillsSriyesh  Krishnan M.D.   On: 08/19/2019 10:08    Procedures Procedures (including critical care time)  Medications Ordered in ED Medications  sodium chloride 0.9 % bolus 1,000 mL (1,000 mLs Intravenous New Bag/Given 08/19/19 1014)     Initial Impression / Assessment and Plan / ED Course  I have reviewed the  triage vital signs and the nursing notes.  Pertinent labs & imaging results that were available during my care of the patient were reviewed by me and considered in my medical decision making (see chart for details).     Jeffery Green is a 38 year old male with history of hypertension who presents the ED  with generalized weakness.  Unremarkable vitals.  No fever.  States he feels dehydrated after drinking alcohol this weekend.  Did not have much water.  Drank an energy drink this morning that made him feel worse.  He denies any chest pain, shortness of breath, cough, fever.  States that his urine is pretty dark.  Denies any muscle aches.  Will evaluate for electrolyte abnormalities, kidney injury.  He appears clinically dry on exam.  Otherwise exam is unremarkable.  Will give IV fluids, check basic labs.  Lab work showed no significant anemia, electrolyte abnormality, kidney injury.  Chest x-ray without signs of infection.  Patient felt better after IV fluids.  Suspect mild dehydration after alcohol this past weekend.  Recommend that he focus on hydration with water and Gatorade and avoid caffeine and energy drinks.  This chart was dictated using voice recognition software.  Despite best efforts to proofread,  errors can occur which can change the documentation meaning.     Final Clinical Impressions(s) / ED Diagnoses   Final diagnoses:  Dehydration    ED Discharge Orders    None       Virgina Norfolk, DO 08/19/19 1134

## 2019-08-19 NOTE — ED Triage Notes (Signed)
Pt arrived EMS c/o chills and weakness after drinking at [facility] energy drink this morning. Pt then took 324 mg aspirin at work after feeling bad. ECG WNL. No c/o chest pain.

## 2019-08-19 NOTE — ED Notes (Signed)
X-ray at bedside

## 2021-07-10 ENCOUNTER — Other Ambulatory Visit: Payer: Self-pay

## 2021-07-10 ENCOUNTER — Encounter (HOSPITAL_COMMUNITY): Payer: Self-pay

## 2021-07-10 ENCOUNTER — Emergency Department (HOSPITAL_COMMUNITY)
Admission: EM | Admit: 2021-07-10 | Discharge: 2021-07-10 | Disposition: A | Discharge status: Home / Self Care | Payer: Medicaid Other | Attending: Emergency Medicine | Admitting: Emergency Medicine

## 2021-07-10 DIAGNOSIS — I10 Essential (primary) hypertension: Secondary | ICD-10-CM | POA: Insufficient documentation

## 2021-07-10 DIAGNOSIS — S3992XA Unspecified injury of lower back, initial encounter: Secondary | ICD-10-CM | POA: Diagnosis present

## 2021-07-10 DIAGNOSIS — M545 Low back pain, unspecified: Secondary | ICD-10-CM

## 2021-07-10 DIAGNOSIS — Z87891 Personal history of nicotine dependence: Secondary | ICD-10-CM | POA: Diagnosis not present

## 2021-07-10 DIAGNOSIS — X500XXA Overexertion from strenuous movement or load, initial encounter: Secondary | ICD-10-CM | POA: Insufficient documentation

## 2021-07-10 MED ORDER — PREDNISONE 20 MG PO TABS
60.0000 mg | ORAL_TABLET | Freq: Once | ORAL | Status: AC
  Administered 2021-07-10: 60 mg via ORAL
  Filled 2021-07-10: qty 3

## 2021-07-10 MED ORDER — KETOROLAC TROMETHAMINE 30 MG/ML IJ SOLN
30.0000 mg | Freq: Once | INTRAMUSCULAR | Status: AC
  Administered 2021-07-10: 30 mg via INTRAMUSCULAR
  Filled 2021-07-10: qty 1

## 2021-07-10 MED ORDER — PREDNISONE 10 MG (21) PO TBPK: ORAL_TABLET | Freq: Every day | ORAL | 0 refills | Status: DC

## 2021-07-10 MED ORDER — METHOCARBAMOL 500 MG PO TABS: 500.0000 mg | ORAL_TABLET | Freq: Two times a day (BID) | ORAL | 0 refills | Status: AC

## 2021-07-10 MED ORDER — PREDNISONE 10 MG (21) PO TBPK: ORAL_TABLET | Freq: Every day | ORAL | 0 refills | Status: AC

## 2021-07-10 NOTE — ED Triage Notes (Signed)
Pt reports back pain for 3 weeks. Pt reports pain is in his lower back and down his left leg, denies any injury.

## 2021-07-10 NOTE — ED Provider Notes (Signed)
[facility]-EM[facility]GENCY DEPT Provider Note   CSN: [id number] Arrival date & time: 07/10/21  1159     History Chief Complaint  Patient presents with   Back Pain    Jeffery Green is a 40 y.o. male.  HPI   40 y/o male with a h/o HLD, HTN, OSA, PAC, psoriasis who presents to the ED for eval of back pain.  Back pain started a few days ago when he was bending over to help his daughter get dressed.  It is located to the left lower back and radiates down the left lower extremity.  It is constant in nature and worse with certain movements and palpation.  He has some associated paresthesias to the left foot but denies any associated weakness.  He denies loss control of bowel or bladder function.  Denies any history of cancer or IV drug use.  He has had no fevers or other systemic symptoms associated with the pain.  Past Medical History:  Diagnosis Date   Gynecomastia, male    HLD (hyperlipidemia)    HTN (hypertension)    OSA (obstructive sleep apnea)    PAC (premature atrial contraction)    Psoriasis     Patient Active Problem List   Diagnosis Date Noted   OSA (obstructive sleep apnea) 10/21/2016   Hypersomnia 08/11/2016   Fatigue 08/04/2016   Memory difficulty 08/04/2016    Past Surgical History:  Procedure Laterality Date   NO PAST SURGERIES         Family History  Problem Relation Age of Onset   Lupus Mother    Heart disease Mother    Kidney disease Mother    Heart attack Father    Drug abuse Father    Diabetes Maternal Grandfather     Social History   Tobacco Use   Smoking status: Former    Packs/day: 1.50    Years: 18.00    Pack years: 27.00    Types: Cigarettes   Smokeless tobacco: Never   Tobacco comments:    uses e-cig and vape 06/02/17  Vaping Use   Vaping Use: Some days   Start date: 11/30/2016  Substance Use Topics   Alcohol use: Yes    Alcohol/week: 14.0 standard drinks    Types: 14 Cans of beer per week    Comment: 2 a  day / trying to cut back   Drug use: No    Home Medications Prior to Admission medications   Medication Sig Start Date End Date Taking? Authorizing Provider  methocarbamol (ROBAXIN) 500 MG tablet Take 1 tablet (500 mg total) by mouth 2 (two) times daily. 07/10/21  Yes Terrie Grajales S, PA-C  ARIPiprazole (ABILIFY) 10 MG tablet Take 1 tab daily 02/07/18   Arfeen, Phillips Grout, MD  calcipotriene-betamethasone (TACLONEX) ointment Apply 1 application topically as needed.    [provider]  predniSONE (STERAPRED UNI-PAK 21 TAB) 10 MG (21) TBPK tablet Take by mouth daily. Take 6 tabs by mouth daily  for 2 days, then 5 tabs for 2 days, then 4 tabs for 2 days, then 3 tabs for 2 days, 2 tabs for 2 days, then 1 tab by mouth daily for 2 days 07/10/21   Nicholi Ghuman S, PA-C  traZODone (DESYREL) 50 MG tablet Take 1 tablet (50 mg total) by mouth at bedtime as needed. for sleep 02/07/18   Arfeen, Phillips Grout, MD    Allergies    Lamictal [lamotrigine]  Review of Systems   Review of  Systems  Constitutional:  Negative for fever.  Respiratory:  Negative for shortness of breath.   Cardiovascular:  Negative for chest pain.  Gastrointestinal:  Negative for abdominal pain, nausea and vomiting.  Genitourinary:        No loss of control of bowel or bladder control  Musculoskeletal:  Positive for back pain.  Neurological:  Negative for weakness and numbness.   Physical Exam Updated Vital Signs BP (!) 166/109   Pulse 98   Temp 97.7 F (36.5 C) (Oral)   Resp 16   SpO2 100%   Physical Exam Constitutional:      General: He is not in acute distress.    Appearance: He is well-developed.  Eyes:     Conjunctiva/sclera: Conjunctivae normal.  Cardiovascular:     Rate and Rhythm: Normal rate and regular rhythm.  Pulmonary:     Effort: Pulmonary effort is normal.     Breath sounds: Normal breath sounds.  Musculoskeletal:     Comments: TTP to the left sciatic notch. No TTP to the lumbar spine. 5/5  strength to the BLE with normal sensation throughout. DP pulse 2+ and symmetric  Skin:    General: Skin is warm and dry.  Neurological:     Mental Status: He is alert and oriented to person, place, and time.    ED Results / Procedures / Treatments   Labs (all labs ordered are listed, but only abnormal results are displayed) Labs Reviewed - No data to display  EKG None  Radiology No results found.  Procedures Procedures   Medications Ordered in ED Medications  ketorolac (TORADOL) 30 MG/ML injection 30 mg (30 mg Intramuscular Given 07/10/21 1248)  predniSONE (DELTASONE) tablet 60 mg (60 mg Oral Given 07/10/21 1247)    ED Course  I have reviewed the triage vital signs and the nursing notes.  Pertinent labs & imaging results that were available during my care of the patient were reviewed by me and considered in my medical decision making (see chart for details).    MDM Rules/Calculators/A&P                          Patient with back pain.  No neurological deficits and normal neuro exam.  Patient can walk but states is painful.  No loss of bowel or bladder control.  No concern for cauda equina.  No fever, night sweats, weight loss, h/o cancer, IVDU.  RICE protocol and pain medicine indicated and discussed with patient.    Final Clinical Impression(s) / ED Diagnoses Final diagnoses:  Acute low back pain, unspecified back pain laterality, unspecified whether sciatica present    Rx / DC Orders ED Discharge Orders          Ordered    methocarbamol (ROBAXIN) 500 MG tablet  2 times daily        07/10/21 1312    predniSONE (STERAPRED UNI-PAK 21 TAB) 10 MG (21) TBPK tablet  Daily,   Status:  Discontinued        07/10/21 1325    predniSONE (STERAPRED UNI-PAK 21 TAB) 10 MG (21) TBPK tablet  Daily        07/10/21 [address] Sinai Mahany S, PA-C 07/10/21 1330    Terald Sleeper, MD 07/10/21 1447

## 2021-07-10 NOTE — Discharge Instructions (Addendum)
Take prednisone as directed.   You were given a prescription for Robaxin which is a muscle relaxer.  You should not drive, work, or operate machinery while taking this medication as it can make you very drowsy.    Please follow up with your primary care provider within 5-7 days for re-evaluation of your symptoms.   Return to the emergency department immediately if you experience any back pain associated with fevers, loss of control of your bowels/bladder, weakness/numbness to your legs, numbness to your groin area, inability to walk, or inability to urinate.   

## 2021-07-17 IMAGING — DX DG CHEST 1V PORT
1 series · 1 of 1 positions shown · non-contrast
Comparison: 09/03/2008

CLINICAL DATA: Weakness, chills

EXAM:
PORTABLE CHEST 1 VIEW

[chest ap]
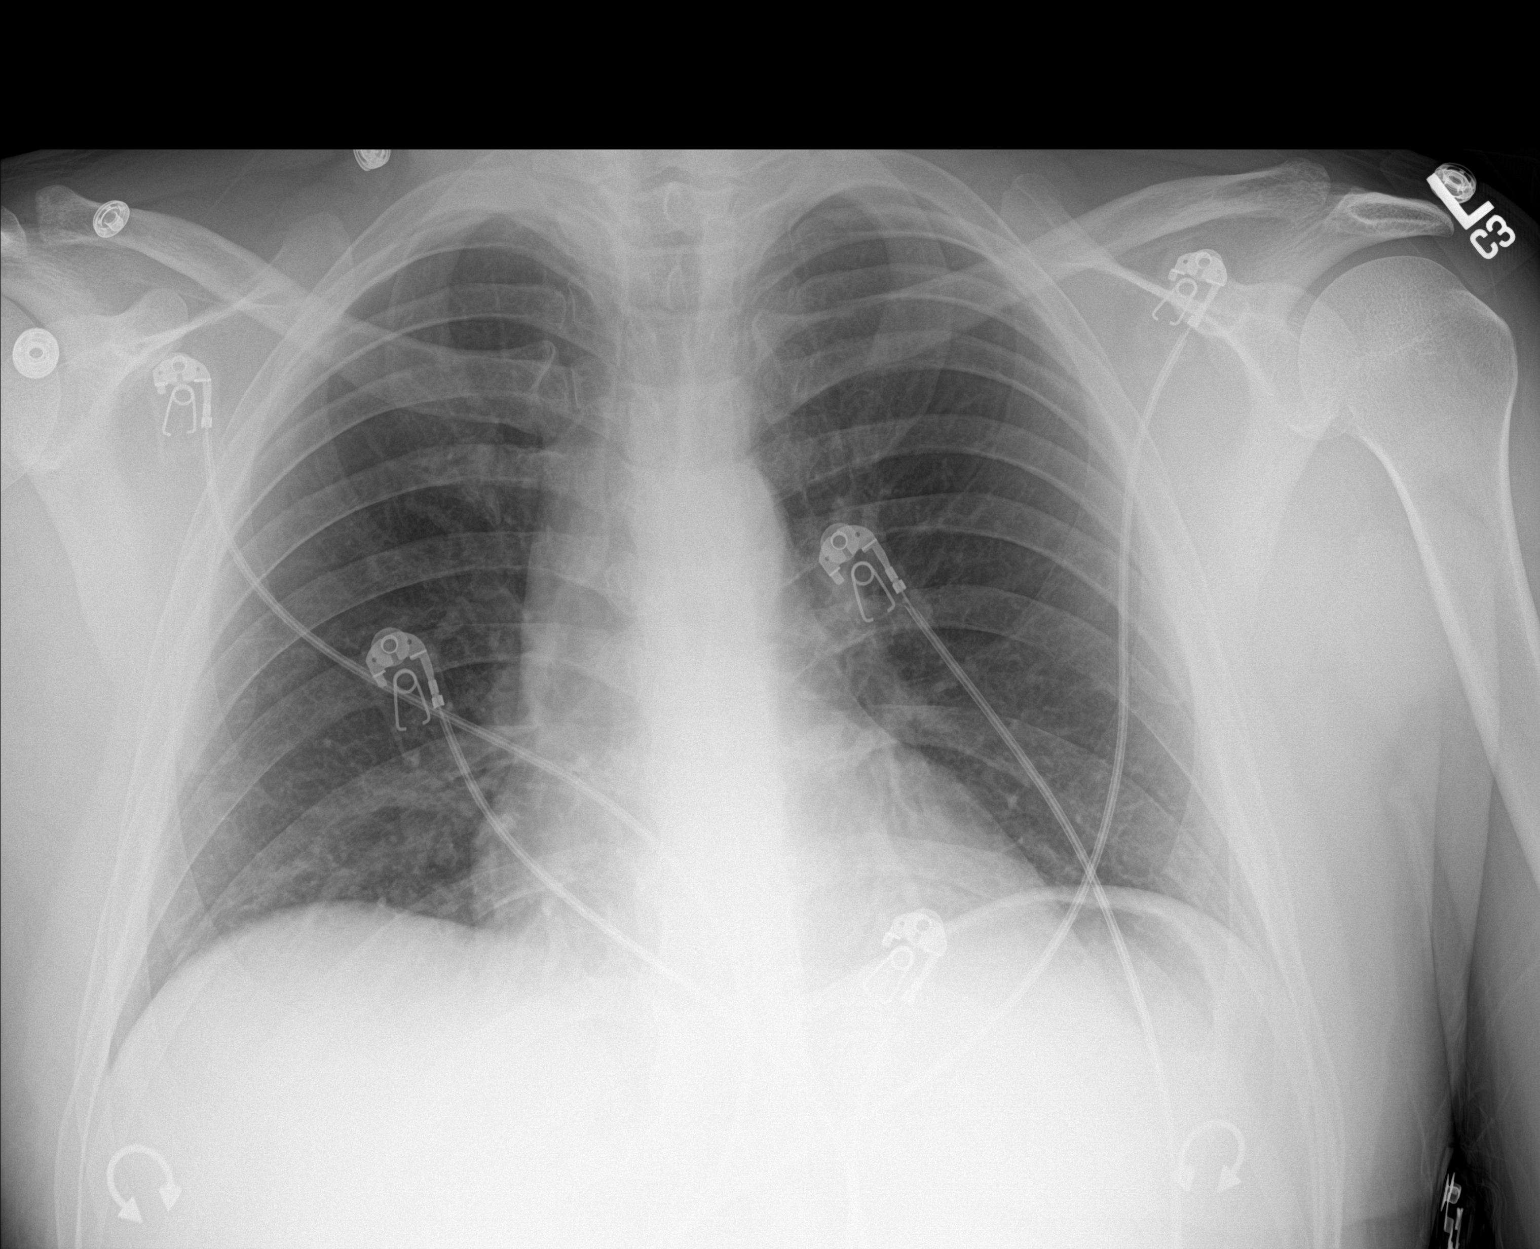

[1 of 1 positions shown; findings below may reference images not displayed]

FINDINGS: Lungs are clear.  No pleural effusion or pneumothorax.

The heart is normal in size.
IMPRESSION: No evidence of acute cardiopulmonary disease.

## 2023-03-27 ENCOUNTER — Emergency Department (HOSPITAL_COMMUNITY)
Admission: EM | Admit: 2023-03-27 | Discharge: 2023-03-27 | Disposition: A | Discharge status: Home / Self Care | Payer: Medicaid Other | Attending: Emergency Medicine | Admitting: Emergency Medicine

## 2023-03-27 ENCOUNTER — Encounter (HOSPITAL_COMMUNITY): Payer: Self-pay | Admitting: Emergency Medicine

## 2023-03-27 ENCOUNTER — Emergency Department (HOSPITAL_COMMUNITY): Payer: Medicaid Other

## 2023-03-27 ENCOUNTER — Other Ambulatory Visit: Payer: Self-pay

## 2023-03-27 DIAGNOSIS — R739 Hyperglycemia, unspecified: Secondary | ICD-10-CM | POA: Diagnosis not present

## 2023-03-27 DIAGNOSIS — R319 Hematuria, unspecified: Secondary | ICD-10-CM | POA: Diagnosis not present

## 2023-03-27 DIAGNOSIS — I10 Essential (primary) hypertension: Secondary | ICD-10-CM | POA: Diagnosis not present

## 2023-03-27 DIAGNOSIS — R109 Unspecified abdominal pain: Secondary | ICD-10-CM | POA: Diagnosis present

## 2023-03-27 DIAGNOSIS — R030 Elevated blood-pressure reading, without diagnosis of hypertension: Secondary | ICD-10-CM

## 2023-03-27 LAB — URINALYSIS, ROUTINE W REFLEX MICROSCOPIC
Bilirubin Urine: NEGATIVE
Glucose, UA: 50 mg/dL — AB
Ketones, ur: NEGATIVE mg/dL
Leukocytes,Ua: NEGATIVE
Nitrite: NEGATIVE
Protein, ur: 100 mg/dL — AB
RBC / HPF: >50 RBC/hpf (ref 0–5)
Specific Gravity, Urine: >1.046 — ABNORMAL HIGH (ref 1.005–1.030)
pH: 5 (ref 5.0–8.0)

## 2023-03-27 LAB — CBC WITH DIFFERENTIAL/PLATELET
Abs Immature Granulocytes: 0.02 10*3/uL (ref 0.00–0.07)
Basophils Absolute: 0.1 10*3/uL (ref 0.0–0.1)
Basophils Relative: 1 %
Eosinophils Absolute: 0.1 10*3/uL (ref 0.0–0.5)
Eosinophils Relative: 2 %
HCT: 46.4 % (ref 39.0–52.0)
Hemoglobin: 15.5 g/dL (ref 13.0–17.0)
Immature Granulocytes: 0 %
Lymphocytes Relative: 33 %
Lymphs Abs: 2.7 10*3/uL (ref 0.7–4.0)
MCH: 30.9 pg (ref 26.0–34.0)
MCHC: 33.4 g/dL (ref 30.0–36.0)
MCV: 92.4 fL (ref 80.0–100.0)
Monocytes Absolute: 0.4 10*3/uL (ref 0.1–1.0)
Monocytes Relative: 5 %
Neutro Abs: 5 10*3/uL (ref 1.7–7.7)
Neutrophils Relative %: 59 %
Platelets: 228 10*3/uL (ref 150–400)
RBC: 5.02 MIL/uL (ref 4.22–5.81)
RDW: 12 % (ref 11.5–15.5)
WBC: 8.3 10*3/uL (ref 4.0–10.5)
nRBC: 0 % (ref 0.0–0.2)

## 2023-03-27 LAB — BASIC METABOLIC PANEL
Anion gap: 10 (ref 5–15)
BUN: 14 mg/dL (ref 6–20)
CO2: 25 mmol/L (ref 22–32)
Calcium: 8.5 mg/dL — ABNORMAL LOW (ref 8.9–10.3)
Chloride: 102 mmol/L (ref 98–111)
Creatinine, Ser: 1.07 mg/dL (ref 0.61–1.24)
GFR, Estimated: >60 mL/min (ref >60)
Glucose, Bld: 228 mg/dL — ABNORMAL HIGH (ref 70–99)
Potassium: 3.9 mmol/L (ref 3.5–5.1)
Sodium: 137 mmol/L (ref 135–145)

## 2023-03-27 MED ORDER — SODIUM CHLORIDE (PF) 0.9 % IJ SOLN
INTRAMUSCULAR | Status: AC
  Filled 2023-03-27: qty 50

## 2023-03-27 MED ORDER — IOHEXOL 300 MG/ML  SOLN
100.0000 mL | Freq: Once | INTRAMUSCULAR | Status: AC | PRN
  Administered 2023-03-27: 100 mL via INTRAVENOUS

## 2023-03-27 NOTE — ED Notes (Signed)
Reminded pt of need for urine sample and provided pt with urinal. Informed pt that we don't need a lot of urine, but that a sample is needed.

## 2023-03-27 NOTE — Discharge Instructions (Signed)
As we discussed, CT imaging of your abdomen shows signs of a kidney stone that is recently passed.  This is likely the cause of the blood in your urine.  This should resolve in the next few days on its own.  I have given you a referral to the wellness center to have repeat testing to ensure that your urine sample has normalized.  Additionally, your blood pressure and blood sugar were mildly elevated today and you need to have a formal evaluation of this by either your primary care doctor or the wellness center.  It is very important that you get this done at your earliest convenience.  Return if development of any new or worsening symptoms.
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
## 2023-03-27 NOTE — ED Notes (Signed)
Pt advises being unable to void for UA at this time.
